# Patient Record
Sex: Female | Born: 1960
Health system: Southern US, Community
[De-identification: ages and names within clinical notes are randomized; demographics above are authoritative.]

## PROBLEM LIST (undated history)

## (undated) DIAGNOSIS — R51 Headache: Secondary | ICD-10-CM

## (undated) HISTORY — PX: TONSILLECTOMY: SUR1361

---

## 2013-01-26 ENCOUNTER — Encounter (HOSPITAL_COMMUNITY): Payer: Self-pay | Admitting: Certified Registered Nurse Anesthetist

## 2013-01-26 ENCOUNTER — Inpatient Hospital Stay (HOSPITAL_COMMUNITY)
Admission: AD | Admit: 2013-01-26 | Discharge: 2013-01-29 | DRG: 581 | Disposition: A | Discharge status: Home Health | Payer: No Typology Code available for payment source | Source: Other Acute Inpatient Hospital | Attending: Internal Medicine | Admitting: Internal Medicine

## 2013-01-26 ENCOUNTER — Encounter (HOSPITAL_COMMUNITY): Payer: Self-pay | Admitting: Internal Medicine

## 2013-01-26 ENCOUNTER — Encounter (HOSPITAL_COMMUNITY): Payer: Self-pay

## 2013-01-26 ENCOUNTER — Encounter (HOSPITAL_COMMUNITY)
Admission: AD | Disposition: A | Discharge status: Home Health | Payer: Self-pay | Source: Other Acute Inpatient Hospital | Attending: Internal Medicine

## 2013-01-26 ENCOUNTER — Inpatient Hospital Stay (HOSPITAL_COMMUNITY): Payer: No Typology Code available for payment source | Admitting: Certified Registered Nurse Anesthetist

## 2013-01-26 DIAGNOSIS — Z23 Encounter for immunization: Secondary | ICD-10-CM

## 2013-01-26 DIAGNOSIS — M65849 Other synovitis and tenosynovitis, unspecified hand: Secondary | ICD-10-CM | POA: Diagnosis present

## 2013-01-26 DIAGNOSIS — R51 Headache: Secondary | ICD-10-CM

## 2013-01-26 DIAGNOSIS — L089 Local infection of the skin and subcutaneous tissue, unspecified: Secondary | ICD-10-CM | POA: Diagnosis present

## 2013-01-26 DIAGNOSIS — D649 Anemia, unspecified: Secondary | ICD-10-CM

## 2013-01-26 DIAGNOSIS — W278XXA Contact with other nonpowered hand tool, initial encounter: Secondary | ICD-10-CM | POA: Diagnosis present

## 2013-01-26 DIAGNOSIS — M65839 Other synovitis and tenosynovitis, unspecified forearm: Secondary | ICD-10-CM | POA: Diagnosis present

## 2013-01-26 DIAGNOSIS — S61209A Unspecified open wound of unspecified finger without damage to nail, initial encounter: Secondary | ICD-10-CM | POA: Diagnosis present

## 2013-01-26 DIAGNOSIS — Z881 Allergy status to other antibiotic agents status: Secondary | ICD-10-CM

## 2013-01-26 DIAGNOSIS — G8929 Other chronic pain: Secondary | ICD-10-CM | POA: Diagnosis present

## 2013-01-26 DIAGNOSIS — G5600 Carpal tunnel syndrome, unspecified upper limb: Secondary | ICD-10-CM | POA: Diagnosis present

## 2013-01-26 DIAGNOSIS — Y92009 Unspecified place in unspecified non-institutional (private) residence as the place of occurrence of the external cause: Secondary | ICD-10-CM

## 2013-01-26 DIAGNOSIS — L02519 Cutaneous abscess of unspecified hand: Principal | ICD-10-CM | POA: Diagnosis present

## 2013-01-26 DIAGNOSIS — L03019 Cellulitis of unspecified finger: Principal | ICD-10-CM | POA: Diagnosis present

## 2013-01-26 DIAGNOSIS — I891 Lymphangitis: Secondary | ICD-10-CM | POA: Diagnosis present

## 2013-01-26 DIAGNOSIS — B950 Streptococcus, group A, as the cause of diseases classified elsewhere: Secondary | ICD-10-CM | POA: Diagnosis present

## 2013-01-26 DIAGNOSIS — L0291 Cutaneous abscess, unspecified: Secondary | ICD-10-CM

## 2013-01-26 DIAGNOSIS — Z9089 Acquired absence of other organs: Secondary | ICD-10-CM

## 2013-01-26 DIAGNOSIS — R519 Headache, unspecified: Secondary | ICD-10-CM | POA: Diagnosis present

## 2013-01-26 DIAGNOSIS — L0390 Cellulitis, unspecified: Secondary | ICD-10-CM

## 2013-01-26 HISTORY — DX: Headache: R51

## 2013-01-26 HISTORY — PX: I & D EXTREMITY: SHX5045

## 2013-01-26 SURGERY — IRRIGATION AND DEBRIDEMENT EXTREMITY
Anesthesia: General | Site: Hand | Laterality: Right | Wound class: Dirty or Infected

## 2013-01-26 MED ORDER — SODIUM CHLORIDE 0.9 % IJ SOLN
3.0000 mL | Freq: Two times a day (BID) | INTRAMUSCULAR | Status: DC
  Administered 2013-01-27 – 2013-01-29 (×3): 3 mL via INTRAVENOUS

## 2013-01-26 MED ORDER — VANCOMYCIN HCL 1000 MG IV SOLR
1000.0000 mg | INTRAVENOUS | Status: DC | PRN
  Administered 2013-01-26: 1000 mg via INTRAVENOUS

## 2013-01-26 MED ORDER — LACTATED RINGERS IV SOLN
INTRAVENOUS | Status: DC | PRN
  Administered 2013-01-26: 23:00:00 via INTRAVENOUS

## 2013-01-26 MED ORDER — BACITRACIN 500 UNIT/GM EX OINT
1.0000 "application " | TOPICAL_OINTMENT | Freq: Once | CUTANEOUS | Status: DC
  Filled 2013-01-26 (×2): qty 0.9

## 2013-01-26 MED ORDER — FENTANYL CITRATE 0.05 MG/ML IJ SOLN
INTRAMUSCULAR | Status: DC | PRN
  Administered 2013-01-26: 50 ug via INTRAVENOUS
  Administered 2013-01-26 (×2): 25 ug via INTRAVENOUS

## 2013-01-26 MED ORDER — ONDANSETRON HCL 4 MG/2ML IJ SOLN
INTRAMUSCULAR | Status: DC | PRN
  Administered 2013-01-26: 4 mg via INTRAVENOUS

## 2013-01-26 MED ORDER — OXYCODONE HCL 5 MG PO TABS: 5.0000 mg | ORAL_TABLET | Freq: Once | ORAL | Status: AC | PRN

## 2013-01-26 MED ORDER — CLINDAMYCIN PHOSPHATE 600 MG/50ML IV SOLN
600.0000 mg | Freq: Three times a day (TID) | INTRAVENOUS | Status: DC
  Administered 2013-01-27 – 2013-01-28 (×5): 600 mg via INTRAVENOUS
  Filled 2013-01-26 (×8): qty 50

## 2013-01-26 MED ORDER — ACETAMINOPHEN 325 MG PO TABS
650.0000 mg | ORAL_TABLET | Freq: Four times a day (QID) | ORAL | Status: DC | PRN
  Administered 2013-01-27 – 2013-01-29 (×5): 650 mg via ORAL
  Filled 2013-01-26 (×5): qty 2

## 2013-01-26 MED ORDER — SODIUM CHLORIDE 0.9 % IR SOLN
Status: DC | PRN
  Administered 2013-01-26: 1

## 2013-01-26 MED ORDER — MIDAZOLAM HCL 5 MG/5ML IJ SOLN
INTRAMUSCULAR | Status: DC | PRN
  Administered 2013-01-26: 2 mg via INTRAVENOUS

## 2013-01-26 MED ORDER — LIDOCAINE HCL (CARDIAC) 20 MG/ML IV SOLN
INTRAVENOUS | Status: DC | PRN
  Administered 2013-01-26: 80 mg via INTRAVENOUS

## 2013-01-26 MED ORDER — HYDROMORPHONE HCL PF 1 MG/ML IJ SOLN
0.2500 mg | INTRAMUSCULAR | Status: DC | PRN
  Administered 2013-01-27: 0.5 mg via INTRAVENOUS
  Administered 2013-01-27: 0.25 mg via INTRAVENOUS
  Administered 2013-01-27: 0.5 mg via INTRAVENOUS
  Filled 2013-01-26 (×2): qty 1

## 2013-01-26 MED ORDER — ONDANSETRON HCL 4 MG PO TABS: 4.0000 mg | ORAL_TABLET | Freq: Four times a day (QID) | ORAL | Status: DC | PRN

## 2013-01-26 MED ORDER — SODIUM CHLORIDE 0.9 % IR SOLN
Status: DC | PRN
  Administered 2013-01-26

## 2013-01-26 MED ORDER — ONDANSETRON HCL 4 MG/2ML IJ SOLN: 4.0000 mg | Freq: Four times a day (QID) | INTRAMUSCULAR | Status: DC | PRN

## 2013-01-26 MED ORDER — ACETAMINOPHEN 650 MG RE SUPP: 650.0000 mg | Freq: Four times a day (QID) | RECTAL | Status: DC | PRN

## 2013-01-26 MED ORDER — PROPOFOL 10 MG/ML IV BOLUS
INTRAVENOUS | Status: DC | PRN
  Administered 2013-01-26: 170 mg via INTRAVENOUS

## 2013-01-26 MED ORDER — OXYCODONE HCL 5 MG/5ML PO SOLN: 5.0000 mg | Freq: Once | ORAL | Status: AC | PRN

## 2013-01-26 MED ORDER — VANCOMYCIN HCL IN DEXTROSE 1-5 GM/200ML-% IV SOLN
INTRAVENOUS | Status: AC
  Filled 2013-01-26: qty 200

## 2013-01-26 MED ORDER — SODIUM CHLORIDE 0.9 % IV SOLN: INTRAVENOUS | Status: DC

## 2013-01-26 SURGICAL SUPPLY — 54 items
BAG DECANTER FOR FLEXI CONT (MISCELLANEOUS) ×2 IMPLANT
BANDAGE ELASTIC 4 VELCRO ST LF (GAUZE/BANDAGES/DRESSINGS) IMPLANT
BANDAGE ELASTIC 6 VELCRO ST LF (GAUZE/BANDAGES/DRESSINGS) IMPLANT
BANDAGE GAUZE ELAST BULKY 4 IN (GAUZE/BANDAGES/DRESSINGS) IMPLANT
BNDG COHESIVE 4X5 TAN STRL (GAUZE/BANDAGES/DRESSINGS) ×2 IMPLANT
CLOTH BEACON ORANGE TIMEOUT ST (SAFETY) ×2 IMPLANT
CORDS BIPOLAR (ELECTRODE) ×2 IMPLANT
COVER SURGICAL LIGHT HANDLE (MISCELLANEOUS) ×2 IMPLANT
CUFF TOURNIQUET SINGLE 18IN (TOURNIQUET CUFF) ×2 IMPLANT
CUFF TOURNIQUET SINGLE 24IN (TOURNIQUET CUFF) IMPLANT
CUFF TOURNIQUET SINGLE 44IN (TOURNIQUET CUFF) IMPLANT
DRAIN PENROSE 1/4X12 LTX STRL (WOUND CARE) ×2 IMPLANT
DRSG EMULSION OIL 3X3 NADH (GAUZE/BANDAGES/DRESSINGS) ×2 IMPLANT
DURAPREP 26ML APPLICATOR (WOUND CARE) ×2 IMPLANT
ELECT REM PT RETURN 9FT ADLT (ELECTROSURGICAL) ×2
ELECTRODE REM PT RTRN 9FT ADLT (ELECTROSURGICAL) ×1 IMPLANT
EVACUATOR 1/8 PVC DRAIN (DRAIN) IMPLANT
GAUZE XEROFORM 1X8 LF (GAUZE/BANDAGES/DRESSINGS) ×2 IMPLANT
GLOVE BIO SURGEON STRL SZ 6.5 (GLOVE) ×2 IMPLANT
GLOVE BIO SURGEON STRL SZ7.5 (GLOVE) ×2 IMPLANT
GLOVE BIOGEL PI IND STRL 6.5 (GLOVE) ×1 IMPLANT
GLOVE BIOGEL PI IND STRL 8 (GLOVE) ×1 IMPLANT
GLOVE BIOGEL PI INDICATOR 6.5 (GLOVE) ×1
GLOVE BIOGEL PI INDICATOR 8 (GLOVE) ×1
GOWN PREVENTION PLUS XXLARGE (GOWN DISPOSABLE) ×2 IMPLANT
GOWN STRL NON-REIN LRG LVL3 (GOWN DISPOSABLE) ×2 IMPLANT
HANDPIECE INTERPULSE COAX TIP (DISPOSABLE)
KIT BASIN OR (CUSTOM PROCEDURE TRAY) ×2 IMPLANT
KIT ROOM TURNOVER OR (KITS) ×2 IMPLANT
MANIFOLD NEPTUNE II (INSTRUMENTS) IMPLANT
NS IRRIG 1000ML POUR BTL (IV SOLUTION) ×2 IMPLANT
PACK ORTHO EXTREMITY (CUSTOM PROCEDURE TRAY) ×2 IMPLANT
PAD ARMBOARD 7.5X6 YLW CONV (MISCELLANEOUS) ×4 IMPLANT
PAD CAST 4YDX4 CTTN HI CHSV (CAST SUPPLIES) ×1 IMPLANT
PADDING CAST COTTON 4X4 STRL (CAST SUPPLIES) ×1
PENCIL BUTTON HOLSTER BLD 10FT (ELECTRODE) IMPLANT
SET HNDPC FAN SPRY TIP SCT (DISPOSABLE) IMPLANT
SPONGE GAUZE 4X4 12PLY (GAUZE/BANDAGES/DRESSINGS) IMPLANT
SPONGE LAP 18X18 X RAY DECT (DISPOSABLE) IMPLANT
STOCKINETTE IMPERVIOUS 9X36 MD (GAUZE/BANDAGES/DRESSINGS) IMPLANT
SUCTION FRAZIER TIP 8 FR DISP (SUCTIONS) ×1
SUCTION TUBE FRAZIER 8FR DISP (SUCTIONS) ×1 IMPLANT
SUT ETHILON 3 0 PS 1 (SUTURE) IMPLANT
SUT ETHILON 4 0 CL P 3 (SUTURE) ×2 IMPLANT
SUT ETHILON 4 0 PS 2 18 (SUTURE) ×2 IMPLANT
SYR 20CC LL (SYRINGE) ×2 IMPLANT
TOWEL OR 17X24 6PK STRL BLUE (TOWEL DISPOSABLE) ×2 IMPLANT
TOWEL OR 17X26 10 PK STRL BLUE (TOWEL DISPOSABLE) ×2 IMPLANT
TUBE ANAEROBIC SPECIMEN COL (MISCELLANEOUS) IMPLANT
TUBE CONNECTING 12X1/4 (SUCTIONS) ×2 IMPLANT
TUBE FEEDING 8FR 16IN STR KANG (MISCELLANEOUS) ×2 IMPLANT
UNDERPAD 30X30 INCONTINENT (UNDERPADS AND DIAPERS) ×2 IMPLANT
WATER STERILE IRR 1000ML POUR (IV SOLUTION) IMPLANT
YANKAUER SUCT BULB TIP NO VENT (SUCTIONS) IMPLANT

## 2013-01-26 NOTE — H&P (Addendum)
Michelle Allison is an 52 y.o. female. Patient was seen and examined on January 26, 2013. PCP - Dr. Garner Nash. Chief Complaint: Patient was transferred from [facility] for further management of the cellulitis of her right hand.   HPI: 52 year-old female with history of chronic headaches and anemia started noticing some swelling last Tuesday around 3 days ago in her index finger. She punctured it with a needle following which swelling worsen with erythema and pain fever and chills. It involved her whole and and forearm. She had gone to her PCP were prescribed doxycycline and Keflex despite which the swelling persisted and she was admitted to the hospital. IV vancomycin and ceftriaxone was given over there and since patient's symptoms did not improve patient was transferred for further management including possible surgery. Patient states that on Monday she had fed the horses. Blood cultures were obtained at [facility] and has to be followed.  Past Medical History  Diagnosis Date  . Headache     Past Surgical History  Procedure Date  . Tonsillectomy     Family History  Problem Relation Age of Onset  . Lymphoma Other    Social History:  reports that she has never smoked. She does not have any smokeless tobacco history on file. She reports that she does not drink alcohol or use illicit drugs.  Allergies:  Allergies  Allergen Reactions  . Augmentin (Amoxicillin-Pot Clavulanate) Rash    No prescriptions prior to admission    No results found for this or any previous visit (from the past 48 hour(s)). No results found.  Review of Systems  Constitutional: Negative.   HENT: Negative.   Eyes: Negative.   Respiratory: Negative.   Cardiovascular: Negative.   Gastrointestinal: Negative.   Genitourinary: Negative.   Musculoskeletal:       Right hand swelling and pain.  Skin: Negative.   Neurological: Negative.   Endo/Heme/Allergies: Negative.   Psychiatric/Behavioral:  Negative.     There were no vitals taken for this visit. Physical Exam  Constitutional: She is oriented to person, place, and time. She appears well-developed and well-nourished. No distress.  HENT:  Head: Normocephalic and atraumatic.  Right Ear: External ear normal.  Left Ear: External ear normal.  Nose: Nose normal.  Mouth/Throat: Oropharynx is clear and moist. No oropharyngeal exudate.  Eyes: Conjunctivae normal are normal. Pupils are equal, round, and reactive to light. Right eye exhibits no discharge. Left eye exhibits no discharge. No scleral icterus.  Neck: Normal range of motion. Neck supple.  Cardiovascular: Regular rhythm.        Mild tachycardia.  Respiratory: Effort normal and breath sounds normal. No respiratory distress. She has no wheezes. She has no rales.  GI: Soft. Bowel sounds are normal. She exhibits no distension. There is no tenderness. There is no rebound.  Musculoskeletal:       Swelling of the right hand with blisters in the right index fingers. Swelling extends to mid forearm with induration and erythema. Fingers are ina flexed position.  Neurological: She is alert and oriented to person, place, and time.       Moves all extremities.  Skin: She is not diaphoretic.  Psychiatric: Her behavior is normal.     Assessment/Plan #1. Cellulitis of the right hand - at this time I have discussed with surgeon Dr. Janee Morn who will be taking patient to the OR. Patient will be placed on vancomycin, clindamycin and Azactam as patient allergic to penicillin. Follow cultures obtained at [facility]  hospital. #2. Chronic anemia - follow labs ordered. As per patients PCP's notes patient has been uptodate on her vaccinations. #3. History of chronic headaches - presently denies any headache.  Patient's metabolic panel and CBC are pending.  CODE STATUS - full code.  KAKRAKANDY,ARSHAD N. 01/26/2013, 11:11 PM

## 2013-01-26 NOTE — Consult Note (Addendum)
ORTHOPAEDIC CONSULTATION  REQUESTING PHYSICIAN: Marinda Elk, MD  Chief Complaint: Right upper extremity infection  HPI: Michelle Allison is a 52 y.o. female who was transferred from [facility] and [facility] for concerns of a right hand infection. She reports that her right hand was less normal on Monday, and Tuesday morning she began to have pain and swelling and a lump forming in the pulp of the index finger. There was no specific prior trauma or known portal of entry. She tried to drain the tip of her finger herself with a needle dipped in alcohol. The finger became more painful, swollen, and she was admitted at 0100 today to the referring institution. I was contacted in the mid afternoon regarding possible transfer at the local general surgeon evaluated the patient and suggested she would benefit from hand surgical specialty consultation. She has been on vancomycin and has received a gram of Rocephin earlier in the day. Over the course of the day the infection has progressed and become much worse now affecting the radial half of the hand with redness streaking up the forearm both volarly and dorsally.  No past medical history on file. No past surgical history on file. History   Social History  . Marital Status: Married    Spouse Name: N/A    Number of Children: N/A  . Years of Education: N/A   Social History Main Topics  . Smoking status: Not on file  . Smokeless tobacco: Not on file  . Alcohol Use: Not on file  . Drug Use: Not on file  . Sexually Active: Not on file   Other Topics Concern  . Not on file   Social History Narrative  . No narrative on file   No family history on file. Allergies not on file Prior to Admission medications   Not on File   No results found.  Positive ROS: All other systems have been reviewed and were otherwise negative with the exception of those mentioned in the HPI and as above.  Physical Exam: Vitals: Refer to  EMR. Constitutional:  WD, WN, NAD HEENT:  NCAT, EOMI Neuro/Psych:  Alert & oriented to person, place, and time; appropriate mood & affect Lymphatic: No generalized UE edema or lymphadenopathy Extremities / MSK:  The extremities are normal with respect to appearance, ranges of motion, joint stability, muscle strength/tone, sensation, & perfusion except as otherwise noted:   The right hand is swollen, with the fingers held in a flexed posture. The long finger, ring finger, and small finger can be passively fully extended without significant pain, but the index finger resistance due to more pain. There is redness involving the index finger and radial half of the hand to include the thumb and thenar eminence. There are small bumps present on the skin of the dorsum of the hand. There is red streaking on the volar surface of the forearm to the level of the elbow, and some discontinuous streaks on the dorsum. There is minimal swelling of the forearm itself. The index finger is much more tightness will ring finger, or small finger. It is fairly caught. There are hemorrhagic bullae on the index finger volar and ulnar portions of the proximal phalanx. She can detect light touch on the surfaces of all the fingertips.  Assessment: Rapidly spreading infection of the right hand, likely with flexor tenosynovitis of the index finger, and ascending lymphangitis to the level of the elbow.  Plan: I discussed these findings with the patient and her husband.  I discussed how infections are primarily treated with antibiotics with adjunctive surgery when indicated.   From a surgical perspective, given her present clinical findings and rapidly evolving course, we will proceed to surgery tonight for exploration and incision and drainage/debridement of the right index finger and hand. I reviewed an operative plan of starting at the fingertip, exploring the bullae and debriding as necessary, incising the palm and continuing with  more proximal exploration until satisfied that the most proximal extent of the suppurative process has been contained. We discussed the possibility of tissue loss and the possible associated need for reconstructive procedures.  Questions were invited and answered. Consent was obtained.  From a medical management perspective, I will initially place the patient on Vancomycin and Zosyn, but defer definitive antibiotic selection & duration to the admitting hospitalist service, who may choose to manage this directly or obtain infectious disease consultation.

## 2013-01-26 NOTE — Preoperative (Signed)
Beta Blockers   Reason not to administer Beta Blockers:Not Applicable. No home beta blockers 

## 2013-01-26 NOTE — Anesthesia Procedure Notes (Signed)
Procedure Name: LMA Insertion Date/Time: 01/26/2013 11:18 PM Performed by: Garen Lah Pre-anesthesia Checklist: Patient identified, Timeout performed, Emergency Drugs available, Suction available and Patient being monitored Patient Re-evaluated:Patient Re-evaluated prior to inductionOxygen Delivery Method: Circle system utilized Preoxygenation: Pre-oxygenation with 100% oxygen Intubation Type: IV induction LMA: LMA inserted LMA Size: 4.0 Number of attempts: 1 Placement Confirmation: positive ETCO2 and breath sounds checked- equal and bilateral Tube secured with: Tape Dental Injury: Teeth and Oropharynx as per pre-operative assessment

## 2013-01-26 NOTE — Anesthesia Preprocedure Evaluation (Addendum)
Anesthesia Evaluation  Patient identified by MRN, date of birth, ID band Patient awake    Reviewed: Allergy & Precautions, H&P , NPO status , Patient's Chart, lab work & pertinent test results, reviewed documented beta blocker date and time   Airway Mallampati: II  Neck ROM: full    Dental  (+) Teeth Intact and Dental Advisory Given   Pulmonary          Cardiovascular     Neuro/Psych  Headaches,    GI/Hepatic   Endo/Other    Renal/GU      Musculoskeletal   Abdominal   Peds  Hematology   Anesthesia Other Findings   Reproductive/Obstetrics                          Anesthesia Physical Anesthesia Plan  ASA: II  Anesthesia Plan: General   Post-op Pain Management:    Induction: Intravenous  Airway Management Planned: LMA  Additional Equipment:   Intra-op Plan:   Post-operative Plan: Extubation in OR  Informed Consent: I have reviewed the patients History and Physical, chart, labs and discussed the procedure including the risks, benefits and alternatives for the proposed anesthesia with the patient or authorized representative who has indicated his/her understanding and acceptance.   Dental advisory given  Plan Discussed with: CRNA and Surgeon  Anesthesia Plan Comments:        Anesthesia Quick Evaluation

## 2013-01-27 LAB — BASIC METABOLIC PANEL
BUN: 9 mg/dL (ref 6–23)
Chloride: 102 mEq/L (ref 96–112)
GFR calc Af Amer: >90 mL/min (ref >90)
Potassium: 3.8 mEq/L (ref 3.5–5.1)
Sodium: 133 mEq/L — ABNORMAL LOW (ref 135–145)

## 2013-01-27 LAB — GRAM STAIN

## 2013-01-27 LAB — CBC
HCT: 31.2 % — ABNORMAL LOW (ref 36.0–46.0)
Hemoglobin: 10.6 g/dL — ABNORMAL LOW (ref 12.0–15.0)
MCHC: 34 g/dL (ref 30.0–36.0)
RBC: 3.56 MIL/uL — ABNORMAL LOW (ref 3.87–5.11)
WBC: 21.9 10*3/uL — ABNORMAL HIGH (ref 4.0–10.5)

## 2013-01-27 LAB — SEDIMENTATION RATE: Sed Rate: 42 mm/hr — ABNORMAL HIGH (ref 0–22)

## 2013-01-27 LAB — C-REACTIVE PROTEIN: CRP: 27.7 mg/dL — ABNORMAL HIGH (ref <0.60)

## 2013-01-27 MED ORDER — DEXTROSE 5 % IV SOLN
1.0000 g | Freq: Two times a day (BID) | INTRAVENOUS | Status: DC
  Administered 2013-01-27 – 2013-01-28 (×3): 1 g via INTRAVENOUS
  Filled 2013-01-27 (×4): qty 1

## 2013-01-27 MED ORDER — DEXTROSE 5 % IV SOLN
2.0000 g | Freq: Once | INTRAVENOUS | Status: AC
  Administered 2013-01-27: 2 g via INTRAVENOUS
  Filled 2013-01-27: qty 2

## 2013-01-27 MED ORDER — VANCOMYCIN HCL IN DEXTROSE 1-5 GM/200ML-% IV SOLN
1000.0000 mg | Freq: Two times a day (BID) | INTRAVENOUS | Status: DC
  Administered 2013-01-27 – 2013-01-28 (×3): 1000 mg via INTRAVENOUS
  Filled 2013-01-27 (×4): qty 200

## 2013-01-27 MED ORDER — BIOTENE DRY MOUTH MT LIQD: 15.0000 mL | Freq: Two times a day (BID) | OROMUCOSAL | Status: DC

## 2013-01-27 MED ORDER — DEXTROSE 5 % IV SOLN
1.0000 g | Freq: Three times a day (TID) | INTRAVENOUS | Status: DC
  Administered 2013-01-27: 1 g via INTRAVENOUS
  Filled 2013-01-27 (×3): qty 1

## 2013-01-27 MED ORDER — DEXTROSE 5 % IV SOLN
1.0000 g | INTRAVENOUS | Status: DC
  Filled 2013-01-27: qty 10

## 2013-01-27 MED ORDER — BIOTENE DRY MOUTH MT LIQD
15.0000 mL | Freq: Two times a day (BID) | OROMUCOSAL | Status: DC
  Administered 2013-01-28: 15 mL via OROMUCOSAL

## 2013-01-27 MED ORDER — INFLUENZA VIRUS VACC SPLIT PF IM SUSP
0.5000 mL | INTRAMUSCULAR | Status: DC
  Filled 2013-01-27: qty 0.5

## 2013-01-27 MED ORDER — PIPERACILLIN-TAZOBACTAM 3.375 G IVPB
3.3750 g | Freq: Three times a day (TID) | INTRAVENOUS | Status: DC
  Filled 2013-01-27 (×2): qty 50

## 2013-01-27 MED ORDER — HYDROMORPHONE HCL PF 1 MG/ML IJ SOLN
INTRAMUSCULAR | Status: AC
  Filled 2013-01-27: qty 1

## 2013-01-27 MED ORDER — LACTATED RINGERS IV SOLN
INTRAVENOUS | Status: DC
  Administered 2013-01-27: 03:00:00 via INTRAVENOUS

## 2013-01-27 MED ORDER — OXYCODONE HCL 5 MG PO TABS
5.0000 mg | ORAL_TABLET | ORAL | Status: DC | PRN
  Administered 2013-01-27 – 2013-01-29 (×9): 5 mg via ORAL
  Filled 2013-01-27 (×8): qty 1

## 2013-01-27 MED ORDER — INDOMETHACIN ER 75 MG PO CPCR
75.0000 mg | ORAL_CAPSULE | Freq: Every day | ORAL | Status: DC
  Administered 2013-01-27 – 2013-01-29 (×3): 75 mg via ORAL
  Filled 2013-01-27 (×4): qty 1

## 2013-01-27 MED ORDER — VANCOMYCIN HCL 10 G IV SOLR
1500.0000 mg | Freq: Once | INTRAVENOUS | Status: AC
  Administered 2013-01-27: 1500 mg via INTRAVENOUS
  Filled 2013-01-27: qty 1500

## 2013-01-27 MED ORDER — VANCOMYCIN HCL 10 G IV SOLR
1500.0000 mg | Freq: Two times a day (BID) | INTRAVENOUS | Status: DC
  Filled 2013-01-27: qty 1500

## 2013-01-27 MED ORDER — OXYCODONE HCL 5 MG PO TABS
10.0000 mg | ORAL_TABLET | ORAL | Status: DC | PRN
  Administered 2013-01-27 – 2013-01-29 (×3): 10 mg via ORAL
  Filled 2013-01-27 (×4): qty 2

## 2013-01-27 NOTE — Progress Notes (Signed)
Subjective: Reportedly feels overall better--less constitutional sickness.  Tol PO, had some bloody drainage today. Pain in limb decreasing   Objective: Vital signs in last 24 hours: Temp:  [97.8 F (36.6 C)-98.8 F (37.1 C)] 98.5 F (36.9 C) (02/06 1327) Pulse Rate:  [105-111] 107  (02/06 1327) Resp:  [18-19] 18  (02/06 1327) BP: (108-115)/(68-71) 108/71 mmHg (02/06 1327) SpO2:  [96 %-97 %] 96 % (02/06 1327) FiO2 (%):  [28 %] 28 % (02/05 2306) Weight:  [78.926 kg (174 lb)] 78.926 kg (174 lb) (02/06 0411)  Intake/Output from previous day: 02/05 0701 - 02/06 0700 In: 750 [I.V.:700; IV Piggyback:50] Out: 0  Intake/Output this shift: Total I/O In: 1971.7 [P.O.:920; I.V.:801.7; IV Piggyback:250] Out: -    Basename 01/27/13 0540  HGB 10.6*    Basename 01/27/13 0540  WBC 21.9*  RBC 3.56*  HCT 31.2*  PLT 169    Basename 01/27/13 0540  NA 133*  K 3.8  CL 102  CO2 23  BUN 9  CREATININE 0.66  GLUCOSE 138*  CALCIUM 7.6*   No results found for this basename: LABPT:2,INR:2 in the last 72 hours  dressing split, wound inspected.  IF tip warm & pink, good turgor, decreased LT sens.   Less redness in streaks on volar / dorsal FA.  Digits still swollen, thenar eminence less tense.  Assessment/Plan: POD 0, process seems to be turning corner. No guiding cx results from here. Will check on blood cx from [facility].  Will manage wound and determinations about whether any more surgical tx indicated, as well as outpt f/u for functional restoration. Will defer antibiotic tx to medicine +/- ID for selection/duration/decision to stop/determination of infection resolution/etc.    Reeve Mallo A. 01/27/2013, 5:56 PM

## 2013-01-27 NOTE — Transfer of Care (Signed)
Immediate Anesthesia Transfer of Care Note  Patient: Michelle Allison  Procedure(s) Performed: Procedure(s) (LRB) with comments: IRRIGATION AND DEBRIDEMENT EXTREMITY (Right)  Patient Location: PACU  Anesthesia Type:General  Level of Consciousness: sedated  Airway & Oxygen Therapy: Patient Spontanous Breathing and Patient connected to nasal cannula oxygen  Post-op Assessment: Report given to PACU RN and Post -op Vital signs reviewed and stable  Post vital signs: Reviewed and stable  Complications: No apparent anesthesia complications

## 2013-01-27 NOTE — Progress Notes (Signed)
ANTIBIOTIC CONSULT NOTE - INITIAL  Pharmacy Consult for vancomycin and aztreonam Indication: cellulitis  Allergies  Allergen Reactions  . Augmentin (Amoxicillin-Pot Clavulanate) Rash    Patient Measurements: Body Weight: 84kg  Vital Signs: Temp: 97.8 F (36.6 C) (02/06 0100) BP: 111/68 mmHg (02/06 0045) Pulse Rate: 111  (02/06 0045)   Microbiology: Recent Results (from the past 720 hour(s))  GRAM STAIN     Status: Normal   Collection Time   01/27/13 12:00 AM      Component Value Range Status Comment   Specimen Description WOUND RIGHT HAND   Final    Special Requests NONE   Final    Gram Stain     Final    Value: RARE WBC PRESENT,BOTH PMN AND MONONUCLEAR     NO ORGANISMS SEEN     CALLED TO J.ZHU RN 0056 01/26/2013 E.GADDY   Report Status 01/27/2013 FINAL   Final     Medical History: Past Medical History  Diagnosis Date  . Headache     Medications:  No prescriptions prior to admission   Scheduled:    . antiseptic oral rinse  15 mL Mouth Rinse BID  . aztreonam  2 g Intravenous Once  . clindamycin (CLEOCIN) IV  600 mg Intravenous Q8H  . HYDROmorphone      . indomethacin  75 mg Oral Q breakfast  . sodium chloride  3 mL Intravenous Q12H  . [DISCONTINUED] antiseptic oral rinse  15 mL Mouth Rinse BID  . [DISCONTINUED] bacitracin  1 application Topical Once    Assessment: 52yo female transferred from [facility] for surgical intervention of cellulitis of finger/hand, now s/p I&D, to continue ABX; had been receiving vanc and Rocephin at [facility] (SCr at [facility] was 0.75).  Goal of Therapy:  Vancomycin trough level 10-15 mcg/ml  Plan:  Dose of vanc at [facility] was 1g Q12H with last dose given 2/5 @1530 ; will continue with vancomycin 1500mg  IV Q12H as well as aztreonam 2g x1 (ordered by MD) followed by 1g IV Q8H and monitor CBC, Cx, levels prn.  Colleen Can PharmD BCPS 01/27/2013,1:39 AM

## 2013-01-27 NOTE — OR Nursing (Signed)
Dr Vivia Ewing in to assess r index finger color, etc, ok with ice to back of hand

## 2013-01-27 NOTE — Care Management Note (Unsigned)
    Page 1 of 1   01/27/2013     4:50:18 PM   CARE MANAGEMENT NOTE 01/27/2013  Patient:  Michelle Allison, Michelle Allison   Account Number:  [id number]  Date Initiated:  01/27/2013  Documentation initiated by:  Letha Cape  Subjective/Objective Assessment:   dx cellulitis  admit- lives with spouse. pta independent.     Action/Plan:   Anticipated DC Date:  01/30/2013   Anticipated DC Plan:  HOME/SELF CARE      DC Planning Services  CM consult      Choice offered to / List presented to:             Status of service:  In process, will continue to follow Medicare Important Message given?   (If response is "NO", the following Medicare IM given date fields will be blank) Date Medicare IM given:   Date Additional Medicare IM given:    Discharge Disposition:    Per UR Regulation:  Reviewed for med. necessity/level of care/duration of stay  If discussed at Long Length of Stay Meetings, dates discussed:    Comments:  01/27/13 16:49 Letha Cape RN, BSN [phone] patient lives with spouse, pta indep.  Patient has medication coverage and transportation at dc.  NCM will cont to follow for dc needs.

## 2013-01-27 NOTE — Progress Notes (Signed)
[facility] PROGRESS NOTE  Assessment/Plan: Cellulitis & abscess: - vanc/zosyn and clindamycin 2.6.2014. She was on rocephin at [facility]  - I&D 2.6.2014. Will consult ID to follow up as an outpatient. - will follow up on cultures at [facility]. - consult ID to follow up on antibiotics. - ESR high repeat in am follow PLT's.  Anemia (01/26/2013) - fertile female. - follow up as an outpatient. - ferritin will be high if check 2/2 to infectious etiology    Code Status: full Family Communication: none  Disposition Plan: home 1-2 days   Consultants:  Orthopedic hand surgery  Procedures:  I&D 2.5.2014  Antibiotics: Vanc/ zosyn and clindamycin 2.6.2014  HPI/Subjective: Continues to have pain  Objective: Filed Vitals:   01/27/13 0030 01/27/13 0045 01/27/13 0100 01/27/13 0411  BP: 115/70 111/68  110/68  Pulse: 109 111  105  Temp: 97.9 F (36.6 C)  97.8 F (36.6 C) 98.8 F (37.1 C)  TempSrc:    Oral  Resp: 18 19  18   Height:    5\' 5"  (1.651 m)  Weight:    78.926 kg (174 lb)  SpO2: 97% 97%      Intake/Output Summary (Last 24 hours) at 01/27/13 1032 Last data filed at 01/27/13 0900  Gross per 24 hour  Intake   1176 ml  Output      0 ml  Net   1176 ml   Filed Weights   01/27/13 0411  Weight: 78.926 kg (174 lb)    Exam:  General: Alert, awake, oriented x3, in no acute distress.  HEENT: No bruits, no goiter.  Heart: Regular rate and rhythm, without murmurs, rubs, gallops.  Lungs: Good air movement, clear to auscultation. Abdomen: Soft, nontender, nondistended, positive bowel sounds.  Neuro: Grossly intact, nonfocal.   Data Reviewed: Basic Metabolic Panel:  Lab 01/27/13 1610  NA 133*  K 3.8  CL 102  CO2 23  GLUCOSE 138*  BUN 9  CREATININE 0.66  CALCIUM 7.6*  MG --  PHOS --   Liver Function Tests: No results found for this basename: AST:5,ALT:5,ALKPHOS:5,BILITOT:5,PROT:5,ALBUMIN:5 in the last 168 hours No results found for this  basename: LIPASE:5,AMYLASE:5 in the last 168 hours No results found for this basename: AMMONIA:5 in the last 168 hours CBC:  Lab 01/27/13 0540  WBC 21.9*  NEUTROABS --  HGB 10.6*  HCT 31.2*  MCV 87.6  PLT 169   Cardiac Enzymes: No results found for this basename: CKTOTAL:5,CKMB:5,CKMBINDEX:5,TROPONINI:5 in the last 168 hours BNP (last 3 results) No results found for this basename: PROBNP:3 in the last 8760 hours CBG: No results found for this basename: GLUCAP:5 in the last 168 hours  Recent Results (from the past 240 hour(s))  WOUND CULTURE     Status: Normal (Preliminary result)   Collection Time   01/27/13 12:00 AM      Component Value Range Status Comment   Specimen Description WOUND RIGHT HAND   Final    Special Requests NONE   Final    Gram Stain     Final    Value: RARE WBC PRESENT, PREDOMINANTLY PMN     NO SQUAMOUS EPITHELIAL CELLS SEEN     NO ORGANISMS SEEN   Culture NO GROWTH   Final    Report Status PENDING   Incomplete   ANAEROBIC CULTURE     Status: Normal (Preliminary result)   Collection Time   01/27/13 12:00 AM      Component Value Range Status Comment   Specimen Description  WOUND RIGHT HAND   Final    Special Requests NONE   Final    Gram Stain     Final    Value: RARE WBC PRESENT, PREDOMINANTLY PMN     NO SQUAMOUS EPITHELIAL CELLS SEEN     NO ORGANISMS SEEN   Culture     Final    Value: NO ANAEROBES ISOLATED; CULTURE IN PROGRESS FOR 5 DAYS   Report Status PENDING   Incomplete   GRAM STAIN     Status: Normal   Collection Time   01/27/13 12:00 AM      Component Value Range Status Comment   Specimen Description WOUND RIGHT HAND   Final    Special Requests NONE   Final    Gram Stain     Final    Value: RARE WBC PRESENT,BOTH PMN AND MONONUCLEAR     NO ORGANISMS SEEN     CALLED TO J.ZHU RN 0056 01/26/2013 E.GADDY   Report Status 01/27/2013 FINAL   Final      Studies: No results found.  Scheduled Meds:   . antiseptic oral rinse  15 mL Mouth Rinse BID   . cefTRIAXone (ROCEPHIN)  IV  1 g Intravenous Q24H  . clindamycin (CLEOCIN) IV  600 mg Intravenous Q8H  . HYDROmorphone      . indomethacin  75 mg Oral Q breakfast  . influenza  inactive virus vaccine  0.5 mL Intramuscular Tomorrow-1000  . sodium chloride  3 mL Intravenous Q12H  . vancomycin  1,000 mg Intravenous Q12H   Continuous Infusions:   . lactated ringers 100 mL/hr at 01/27/13 0324     Marinda Elk  [facility] Pager [phone]. If 8PM-8AM, please contact night-coverage at [email or web address], password [facility] 01/27/2013, 10:32 AM  LOS: 1 day

## 2013-01-27 NOTE — Progress Notes (Addendum)
ANTIBIOTIC CONSULT NOTE - INITIAL  Pharmacy Consult for Zosyn & Vancomycin Indication: Infection of finger/hand  Allergies  Allergen Reactions  . Augmentin (Amoxicillin-Pot Clavulanate) Rash   Patient Measurements: Height: 5\' 5"  (165.1 cm) Weight: 174 lb (78.926 kg) IBW/kg (Calculated) : 57   Vital Signs: Temp: 98.8 F (37.1 C) (02/06 0411) Temp src: Oral (02/06 0411) BP: 110/68 mmHg (02/06 0411) Pulse Rate: 105  (02/06 0411)  Labs:  Basename 01/27/13 0540  WBC 21.9*  HGB 10.6*  PLT 169  LABCREA --  CREATININE 0.66   Estimated Creatinine Clearance: 88.4 ml/min (by C-G formula based on Cr of 0.66).   Microbiology: Recent Results (from the past 720 hour(s))  WOUND CULTURE     Status: Normal (Preliminary result)   Collection Time   01/27/13 12:00 AM      Component Value Range Status Comment   Specimen Description WOUND RIGHT HAND   Final    Special Requests NONE   Final    Gram Stain     Final    Value: RARE WBC PRESENT, PREDOMINANTLY PMN     NO SQUAMOUS EPITHELIAL CELLS SEEN     NO ORGANISMS SEEN   Culture NO GROWTH   Final    Report Status PENDING   Incomplete   ANAEROBIC CULTURE     Status: Normal (Preliminary result)   Collection Time   01/27/13 12:00 AM      Component Value Range Status Comment   Specimen Description WOUND RIGHT HAND   Final    Special Requests NONE   Final    Gram Stain     Final    Value: RARE WBC PRESENT, PREDOMINANTLY PMN     NO SQUAMOUS EPITHELIAL CELLS SEEN     NO ORGANISMS SEEN   Culture PENDING   Incomplete    Report Status PENDING   Incomplete   GRAM STAIN     Status: Normal   Collection Time   01/27/13 12:00 AM      Component Value Range Status Comment   Specimen Description WOUND RIGHT HAND   Final    Special Requests NONE   Final    Gram Stain     Final    Value: RARE WBC PRESENT,BOTH PMN AND MONONUCLEAR     NO ORGANISMS SEEN     CALLED TO J.ZHU RN 0056 01/26/2013 E.GADDY   Report Status 01/27/2013 FINAL   Final      Assessment: 52 yo female continuing on IV antibiotics for cellulitis of finger/hand, now s/p I&D. Received vancomycin and rocephin at [facility] prior to transfer to [facility]. Patient is afebrile, WBC 21.9, CrCl ~20ml/min, wound cultures pending. MD aware of rash with Augmentin (per patient record has tolerated other beta-lactams).   Vancomycin 2/6 >>  Clindamycin 2/6>> Zosyn 2/6 >> Aztreonam 2/6 - 2/6 (recieved 2 doses)  Goal of Therapy:  Vancomycin trough level 10-15 mcg/ml Eradication of infection  Plan:  1) Change Vancomycin to 1000mg  IV  Q12 hours 2) Start Zosyn 3.375g Q8 hours (4-hr infusion) 3) Benjaman Pott, PharmD 01/27/2013   9:54 AM  -----  Addendum: Discussed with Dr. David Stall. Patient has history of rash with Augmentin but has tolerated cephalosporins. MD changed Zosyn to cefepime.  Plan:  1) DC Zosyn (patient did not get dose) 2) Start cefepime 1g IV Q12 hours 3) F/u culture data, renal function, LOT, clinical status, f/u ID recommendations  Benjaman Pott, PharmD, BCPS 01/27/2013   12:15 PM

## 2013-01-27 NOTE — OR Nursing (Signed)
Has mild pink/bluish discoloration r index finger/ able to minimally wiggle exposed fingers and thumb/ diminshed sensation

## 2013-01-27 NOTE — Anesthesia Postprocedure Evaluation (Signed)
Anesthesia Post Note  Patient: Michelle Allison  Procedure(s) Performed: Procedure(s) (LRB): IRRIGATION AND DEBRIDEMENT EXTREMITY (Right)  Anesthesia type: General  Patient location: PACU  Post pain: Pain level controlled and Adequate analgesia  Post assessment: Post-op Vital signs reviewed, Patient's Cardiovascular Status Stable, Respiratory Function Stable, Patent Airway and Pain level controlled  Last Vitals:  Filed Vitals:   01/27/13 0100  BP:   Pulse:   Temp: 36.6 C  Resp:     Post vital signs: Reviewed and stable  Level of consciousness: awake, alert  and oriented  Complications: No apparent anesthesia complications

## 2013-01-27 NOTE — Op Note (Signed)
01/26/2013 - 01/27/2013  12:42 AM  PATIENT:  Michelle Allison  52 y.o. female  PRE-OPERATIVE DIAGNOSIS:  Rapidly evolving infection of right hand  POST-OPERATIVE DIAGNOSIS:  Same  PROCEDURE:  Incision and drainage of right index finger and hand with tenosynovium debridement, debridement of hemorrhagic bullae, and carpal tunnel release  SURGEON: Cliffton Asters. Janee Morn, MD  PHYSICIAN ASSISTANT: None  ANESTHESIA:  general  SPECIMENS:  None  DRAINS:   None  PREOPERATIVE INDICATIONS:  [name] is a  52 y.o. female with a diagnosis of right hand infection who failed conservative measures and elected for surgical management.    The risks benefits and alternatives were discussed with the patient preoperatively including but not limited to the risks of infection, bleeding, nerve injury, cardiopulmonary complications, the need for revision surgery, among others, and the patient verbalized understanding and consented to proceed.  OPERATIVE IMPLANTS: None  OPERATIVE FINDINGS: Seropurulent fluid throughout the right index finger, particularly within the tendon sheath and volar subcutaneous space. The flexor tenosynovium of the index finger flexor tendons was of a slimy, snotty consistency. It was similar fluid in the mid palm, radial to the midline septum, extending into the distal aspect of the carpal tunnel but not beyond. No frank pus was encountered.  OPERATIVE PROCEDURE:  After receiving prophylactic antibiotics, the patient was escorted to the operative theatre and placed in a supine position.  A surgical "time-out" was performed during which the planned procedure, proposed operative site, and the correct patient identity were compared to the operative consent and agreement confirmed by the circulating nurse according to current facility policy.  General anesthesia was administered. Following application of a tourniquet to the operative extremity, the exposed skin was prepped with Chloraprep and  draped in the usual sterile fashion.  The limb was exsanguinated with elevation and the tourniquet inflated to approximately higher than systolic BP.  The  epidermis over the hemorrhagic bullae was debrided back to stable skin. The dermis underneath the volar was mottled & injected. Initially an oblique incision was made over the pulp of the index finger. Fluid that was seropurulent was encountered and this was cultured. Cultures were sent for AFB, fungus, aerobic and anaerobic cultures.  A zigzag incision was made from the proximal palmar crease of the index finger eventually to the distal wrist crease. The A1 pulley was not split. Proximal to the A1 pulley fluid could be seen coming from the flexor tendon sheath and the tenosynovium was snotty. It was debrided. Using a pediatric feeding tube, the index finger volar subcutaneous space was irrigated from proximal to distal with good egress of fluid. The tendon sheath was also irrigated in this manner both from proximal to distal and distal to proximal. That seropurulent fluid was encountered all the way to the distal aspect of the transverse carpal ligament. The carpal tunnel was released and the distal forearm fascia was split for another couple inches proximal to the proximal end of the incision to ensure that there was no compression of the median nerve. The tissues in the carpal tunnel and proximal to it appeared normal. The thenar musculature was normal, pink and viable. The open wound was very copiously irrigated the tourniquet was released. The finger was pale for quite some time but was pink and the recovery room. The skin was closed with 4-0 nylon interrupted sutures with a small Penrose drain emanating from 2 portions of the wound. The distal wound on the fingertip was closed with one single  simple suture. A bulky hand dressing was applied and she was taken to recovery room stable condition breathing spontaneously  DISPOSITION: She'll be  transferred back to the floor for continued inpatient care. Antibiotic management is deferred to the hospitalists.  I will plan to take the dressing down and further evaluate the wound later today or Friday.

## 2013-01-28 ENCOUNTER — Encounter (HOSPITAL_COMMUNITY): Payer: Self-pay | Admitting: Orthopedic Surgery

## 2013-01-28 DIAGNOSIS — L02519 Cutaneous abscess of unspecified hand: Secondary | ICD-10-CM

## 2013-01-28 DIAGNOSIS — L03119 Cellulitis of unspecified part of limb: Secondary | ICD-10-CM

## 2013-01-28 DIAGNOSIS — B950 Streptococcus, group A, as the cause of diseases classified elsewhere: Secondary | ICD-10-CM

## 2013-01-28 DIAGNOSIS — L089 Local infection of the skin and subcutaneous tissue, unspecified: Secondary | ICD-10-CM | POA: Diagnosis present

## 2013-01-28 LAB — CBC
MCH: 29.5 pg (ref 26.0–34.0)
MCHC: 33.4 g/dL (ref 30.0–36.0)
Platelets: 160 10*3/uL (ref 150–400)

## 2013-01-28 LAB — SEDIMENTATION RATE: Sed Rate: 68 mm/hr — ABNORMAL HIGH (ref 0–22)

## 2013-01-28 MED ORDER — INDOMETHACIN ER 75 MG PO CPCR: 75.0000 mg | ORAL_CAPSULE | Freq: Every day | ORAL | Status: DC

## 2013-01-28 MED ORDER — DIPHENHYDRAMINE HCL 25 MG PO CAPS
25.0000 mg | ORAL_CAPSULE | Freq: Three times a day (TID) | ORAL | Status: DC | PRN
  Administered 2013-01-28: 25 mg via ORAL
  Filled 2013-01-28: qty 1

## 2013-01-28 MED ORDER — OXYCODONE HCL 5 MG PO TABS: 5.0000 mg | ORAL_TABLET | ORAL | Status: DC | PRN

## 2013-01-28 MED ORDER — DEXTROSE 5 % IV SOLN
1.0000 g | INTRAVENOUS | Status: DC
  Administered 2013-01-28 – 2013-01-29 (×2): 1 g via INTRAVENOUS
  Filled 2013-01-28 (×2): qty 10

## 2013-01-28 NOTE — Progress Notes (Signed)
Patient and her brother state that the patients blood pressure is 100/65. She states he normal blood pressure is always in the 110's when she goes to her doctor. It was explained to her and her brother that it may be a little low due her just waking up and that she has been taking pain medicines for the last day for her pain. Pt and brother were addiment that the provider on call was notified. MD Janee Morn was paged and made aware. No new orders were given. Will continue to monitor.   Michelle Bruins RN BSN

## 2013-01-28 NOTE — Consult Note (Signed)
[facility]    Date of Admission:  01/26/2013    Total days of antibiotics 4        Day 3 vancomycin        Day 2 cefepime        Day 2 clindamycin       Reason for Consult: Severe right hand infection    Referring Physician: Dr. Rosine Beat  Principal Problem:  *Group A streptococcal infection Active Problems:  Infection of hand  Headache  Anemia      . antiseptic oral rinse  15 mL Mouth Rinse BID  . ceFEPime (MAXIPIME) IV  1 g Intravenous Q12H  . clindamycin (CLEOCIN) IV  600 mg Intravenous Q8H  . indomethacin  75 mg Oral Q breakfast  . influenza  inactive virus vaccine  0.5 mL Intramuscular Tomorrow-1000  . sodium chloride  3 mL Intravenous Q12H  . vancomycin  1,000 mg Intravenous Q12H    Recommendations: 1. Consolidate antibiotic therapy to IV ceftriaxone 2. PICC placement 3. Arrange outpatient IV antibiotic therapy  4. Please call Dr. Enedina Finner [phone]) for any infectious disease questions this weekend  Assessment: Michelle Allison has severe, rapidly progressive group A streptococcal infection involving her right hand and wrist. Group A strep is a skin organism that was probably introduced by a splinter or the needle Michelle Allison used to try to remove the potential splinter. It is probably acting alone and I feel it is best that we narrow therapy to IV ceftriaxone. Given the severe, progressive nature of the infection involving her dominant hand I favor continuing treatment with IV antibiotics for at least 10-14 days. Final duration of therapy will depend on serial examination of her wounds and consultation with Dr. Janee Morn. Michelle Allison is in agreement with this plan. I will last have a PICC placed and arrange outpatient IV ceftriaxone. I will also arrange to have her followed up in our clinic next week.    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 Michelle Allison probed the area at the tip of the finger with a slowly needle but could not remove anything. The needle drew some blood. Her finger was all ready somewhat red and swollen at that time. Over the next 24 hour Michelle Allison had rapid progression of swelling and pain extending up her hand. Michelle Allison also noted subjective fevers, shaking chills and myalgias. Michelle Allison saw her primary care physician and was started on oral doxycycline and cephalexin but had rapid progression leading to admission at [facility]. Michelle Allison was treated with IV vancomycin and ceftriaxone and transferred here on February 5. Blood cultures [facility] are negative at 48 hours. Michelle Allison underwent right hand surgery 24 hours ago revealing pus along her index finger and upper palm to the level of the wrist. Operative cultures have grown group A strep. Michelle Allison is starting to feel much better.   Review of Systems: Pertinent items are noted in HPI.  Past Medical History  Diagnosis Date  . Headache     History  Substance Use Topics  . Smoking status: Never Smoker   . Smokeless tobacco: Not on file  . Alcohol Use: No    Family History  Problem Relation Age of Onset  . Lymphoma Other    Allergies  Allergen Reactions  . Augmentin (Amoxicillin-Pot Clavulanate) Rash    OBJECTIVE: Blood  pressure 100/52, pulse 95, temperature 98.2 F (36.8 C), temperature source Oral, resp. rate 18, height 5\' 5"  (1.651 m), weight 78.926 kg (174 lb), SpO2 98.00%. General: Michelle Allison is slightly groggy but in good spirits Skin: Her right hand is in a large bulky dressing. Michelle Allison has no lymphangitis above the dressing. Michelle Allison has no generalized rash, splinter or conjunctival hemorrhages. Lymph nodes: No palpable adenopathy Lungs: Clear Cor: Regular S1 and S2 with no murmurs Abdomen: Soft and nontender Joints and extremities: Normal other than right hand  Microbiology: Recent Results (from the past 240 hour(s))  WOUND CULTURE     Status: Normal (Preliminary  result)   Collection Time   01/27/13 12:00 AM      Component Value Range Status Comment   Specimen Description WOUND RIGHT HAND   Final    Special Requests NONE   Final    Gram Stain     Final    Value: RARE WBC PRESENT, PREDOMINANTLY PMN     NO SQUAMOUS EPITHELIAL CELLS SEEN     NO ORGANISMS SEEN   Culture FEW GROUP A STREP (S.PYOGENES) ISOLATED   Final    Report Status PENDING   Incomplete   ANAEROBIC CULTURE     Status: Normal (Preliminary result)   Collection Time   01/27/13 12:00 AM      Component Value Range Status Comment   Specimen Description WOUND RIGHT HAND   Final    Special Requests NONE   Final    Gram Stain     Final    Value: RARE WBC PRESENT, PREDOMINANTLY PMN     NO SQUAMOUS EPITHELIAL CELLS SEEN     NO ORGANISMS SEEN   Culture     Final    Value: NO ANAEROBES ISOLATED; CULTURE IN PROGRESS FOR 5 DAYS   Report Status PENDING   Incomplete   GRAM STAIN     Status: Normal   Collection Time   01/27/13 12:00 AM      Component Value Range Status Comment   Specimen Description WOUND RIGHT HAND   Final    Special Requests NONE   Final    Gram Stain     Final    Value: RARE WBC PRESENT,BOTH PMN AND MONONUCLEAR     NO ORGANISMS SEEN     CALLED TO J.ZHU RN 0056 01/26/2013 E.GADDY   Report Status 01/27/2013 FINAL   Final     Cliffton Asters, MD [facility] [phone] pager   [phone] cell 01/28/2013, 4:47 PM

## 2013-01-28 NOTE — Progress Notes (Signed)
[facility] PROGRESS NOTE  Assessment/Plan: Cellulitis & abscess: - vanc/zosyn and clindamycin 2.6.2014. She was on rocephin at [facility], get records form [facility]. - I&D 2.6.2014. Will consult ID to follow up as an outpatient. - ESR high still high leukocytosis improving.  Anemia (01/26/2013) - fertile female. - follow up as an outpatient. - ferritin will be high if check 2/2 to infectious etiology    Code Status: full Family Communication: none  Disposition Plan: home 1-2 days   Consultants:  Orthopedic hand surgery  Procedures:  I&D 2.5.2014  Antibiotics: Vanc/ zosyn and clindamycin 2.6.2014  HPI/Subjective: Pain control, numb index finger.  Objective: Filed Vitals:   01/27/13 0411 01/27/13 1327 01/27/13 2059 01/28/13 0546  BP: 110/68 108/71 100/66 100/65  Pulse: 105 107 90 102  Temp: 98.8 F (37.1 C) 98.5 F (36.9 C) 98.2 F (36.8 C) 98.4 F (36.9 C)  TempSrc: Oral Oral Oral Oral  Resp: 18 18 20 18   Height: 5\' 5"  (1.651 m)     Weight: 78.926 kg (174 lb)     SpO2:  96% 95% 94%    Intake/Output Summary (Last 24 hours) at 01/28/13 0959 Last data filed at 01/27/13 1550  Gross per 24 hour  Intake 1495.67 ml  Output      0 ml  Net 1495.67 ml   Filed Weights   01/27/13 0411  Weight: 78.926 kg (174 lb)    Exam:  General: Alert, awake, oriented x3, in no acute distress.  HEENT: No bruits, no goiter.  Heart: Regular rate and rhythm, without murmurs, rubs, gallops.  Lungs: Good air movement, clear to auscultation. Abdomen: Soft, nontender, nondistended, positive bowel sounds.  Neuro: Grossly intact, nonfocal.   Data Reviewed: Basic Metabolic Panel:  Lab 01/27/13 1610  NA 133*  K 3.8  CL 102  CO2 23  GLUCOSE 138*  BUN 9  CREATININE 0.66  CALCIUM 7.6*  MG --  PHOS --   Liver Function Tests: No results found for this basename: AST:5,ALT:5,ALKPHOS:5,BILITOT:5,PROT:5,ALBUMIN:5 in the last 168 hours No results found for this basename:  LIPASE:5,AMYLASE:5 in the last 168 hours No results found for this basename: AMMONIA:5 in the last 168 hours CBC:  Lab 01/28/13 0550 01/27/13 0540  WBC 13.4* 21.9*  NEUTROABS -- --  HGB 10.4* 10.6*  HCT 31.1* 31.2*  MCV 88.4 87.6  PLT 160 169   Cardiac Enzymes: No results found for this basename: CKTOTAL:5,CKMB:5,CKMBINDEX:5,TROPONINI:5 in the last 168 hours BNP (last 3 results) No results found for this basename: PROBNP:3 in the last 8760 hours CBG: No results found for this basename: GLUCAP:5 in the last 168 hours  Recent Results (from the past 240 hour(s))  WOUND CULTURE     Status: Normal (Preliminary result)   Collection Time   01/27/13 12:00 AM      Component Value Range Status Comment   Specimen Description WOUND RIGHT HAND   Final    Special Requests NONE   Final    Gram Stain     Final    Value: RARE WBC PRESENT, PREDOMINANTLY PMN     NO SQUAMOUS EPITHELIAL CELLS SEEN     NO ORGANISMS SEEN   Culture FEW GROUP A STREP (S.PYOGENES) ISOLATED   Final    Report Status PENDING   Incomplete   ANAEROBIC CULTURE     Status: Normal (Preliminary result)   Collection Time   01/27/13 12:00 AM      Component Value Range Status Comment   Specimen Description WOUND RIGHT HAND  Final    Special Requests NONE   Final    Gram Stain     Final    Value: RARE WBC PRESENT, PREDOMINANTLY PMN     NO SQUAMOUS EPITHELIAL CELLS SEEN     NO ORGANISMS SEEN   Culture     Final    Value: NO ANAEROBES ISOLATED; CULTURE IN PROGRESS FOR 5 DAYS   Report Status PENDING   Incomplete   GRAM STAIN     Status: Normal   Collection Time   01/27/13 12:00 AM      Component Value Range Status Comment   Specimen Description WOUND RIGHT HAND   Final    Special Requests NONE   Final    Gram Stain     Final    Value: RARE WBC PRESENT,BOTH PMN AND MONONUCLEAR     NO ORGANISMS SEEN     CALLED TO J.ZHU RN 0056 01/26/2013 E.GADDY   Report Status 01/27/2013 FINAL   Final      Studies: No results  found.  Scheduled Meds:    . antiseptic oral rinse  15 mL Mouth Rinse BID  . ceFEPime (MAXIPIME) IV  1 g Intravenous Q12H  . clindamycin (CLEOCIN) IV  600 mg Intravenous Q8H  . indomethacin  75 mg Oral Q breakfast  . influenza  inactive virus vaccine  0.5 mL Intramuscular Tomorrow-1000  . sodium chloride  3 mL Intravenous Q12H  . vancomycin  1,000 mg Intravenous Q12H   Continuous Infusions:    . lactated ringers 100 mL/hr at 01/27/13 0324     Marinda Elk  [facility] Pager [phone].  If 8PM-8AM, please contact night-coverage at [email or web address], password [facility] 01/28/2013, 9:59 AM  LOS: 2 days

## 2013-01-28 NOTE — Progress Notes (Addendum)
Subjective: POD 1+,  cx with Group A strep, appreciate ID taking lead on antibiotic treatment  Objective: Vital signs in last 24 hours: Temp:  [98.2 F (36.8 C)-98.4 F (36.9 C)] 98.2 F (36.8 C) (02/07 1431) Pulse Rate:  [90-102] 95  (02/07 1431) Resp:  [18-20] 18  (02/07 1431) BP: (100)/(52-66) 100/52 mmHg (02/07 1431) SpO2:  [94 %-98 %] 98 % (02/07 1431)  Intake/Output from previous day: 02/06 0701 - 02/07 0700 In: 1971.7 [P.O.:920; I.V.:801.7; IV Piggyback:250] Out: -  Intake/Output this shift:     Basename 01/28/13 0550 01/27/13 0540  HGB 10.4* 10.6*    Basename 01/28/13 0550 01/27/13 0540  WBC 13.4* 21.9*  RBC 3.52* 3.56*  HCT 31.1* 31.2*  PLT 160 169    Basename 01/27/13 0540  NA 133*  K 3.8  CL 102  CO2 23  BUN 9  CREATININE 0.66  GLUCOSE 138*  CALCIUM 7.6*   No results found for this basename: LABPT:2,INR:2 in the last 72 hours  Dressing removed, wounds re-dressed.  Redness receeding, drain pulled.  Digits still swollen, thenar eminence less tense, but still red.  Some clear thin fluid draining from drain holes.  Index finger skin adjacent to blisters is macerated.  Injected dermis intact where blisters debrided.  Assessment/Plan: POD 1+, process seems to be turning corner.  May D/C home with home IV antibiotic plan tomorrow if [facility] services in place. RTC to see me on Wednesday. Daily dressing changes.  Will begin formal hand therapy next week.  Dr. Yevette Edwards & PA Jason Coop covering over this weekend.  Will defer antibiotic tx to medicine +/- ID for selection/duration/decision to stop/determination of infection resolution/etc.    Michelle Allison A. 01/28/2013, 5:09 PM

## 2013-01-29 ENCOUNTER — Inpatient Hospital Stay (HOSPITAL_COMMUNITY): Payer: No Typology Code available for payment source

## 2013-01-29 DIAGNOSIS — B950 Streptococcus, group A, as the cause of diseases classified elsewhere: Secondary | ICD-10-CM

## 2013-01-29 DIAGNOSIS — L089 Local infection of the skin and subcutaneous tissue, unspecified: Secondary | ICD-10-CM

## 2013-01-29 LAB — WOUND CULTURE

## 2013-01-29 MED ORDER — DEXTROSE 5 % IV SOLN: 1.0000 g | INTRAVENOUS | Status: DC

## 2013-01-29 MED ORDER — POLYETHYLENE GLYCOL 3350 17 G PO PACK
17.0000 g | PACK | Freq: Every day | ORAL | Status: DC
  Filled 2013-01-29: qty 1

## 2013-01-29 MED ORDER — ACETAMINOPHEN 325 MG PO TABS: 650.0000 mg | ORAL_TABLET | Freq: Once | ORAL | Status: DC

## 2013-01-29 MED ORDER — PANTOPRAZOLE SODIUM 40 MG PO TBEC
40.0000 mg | DELAYED_RELEASE_TABLET | Freq: Two times a day (BID) | ORAL | Status: DC
  Administered 2013-01-29: 40 mg via ORAL
  Filled 2013-01-29: qty 1

## 2013-01-29 MED ORDER — PANTOPRAZOLE SODIUM 40 MG PO TBEC: 40.0000 mg | DELAYED_RELEASE_TABLET | Freq: Two times a day (BID) | ORAL | Status: DC

## 2013-01-29 MED ORDER — FERROUS SULFATE 325 (65 FE) MG PO TABS: 325.0000 mg | ORAL_TABLET | Freq: Three times a day (TID) | ORAL | Status: DC

## 2013-01-29 MED ORDER — HEPARIN SOD (PORK) LOCK FLUSH 100 UNIT/ML IV SOLN
250.0000 [IU] | INTRAVENOUS | Status: AC | PRN
  Administered 2013-01-29: 250 [IU]

## 2013-01-29 MED ORDER — INFLUENZA VIRUS VACC SPLIT PF IM SUSP
0.5000 mL | INTRAMUSCULAR | Status: DC
  Filled 2013-01-29: qty 0.5

## 2013-01-29 NOTE — Progress Notes (Signed)
CARE MANAGEMENT NOTE 01/29/2013  Patient:  Michelle Allison, Michelle Allison   Account Number:  [id number]  Date Initiated:  01/27/2013  Documentation initiated by:  Letha Cape  Subjective/Objective Assessment:   dx cellulitis  admit- lives with spouse. pta independent.     Action/Plan:   CM spoke with patient concerning home health needs at discharge. Choice offered. Patient will have PICC inserted for two weeks of IV Ceftrioxone. Faxed orders to [facility].   Anticipated DC Date:  01/29/2013   Anticipated DC Plan:  HOME W HOME HEALTH SERVICES      DC Planning Services  CM consult      [facility]  HOME HEALTH   Choice offered to / List presented to:  C-1 Patient        HH arranged  HH-1 RN  IV Antibiotics      HH agency  [facility]   Status of service:  Completed, signed off Medicare Important Message given?   (If response is "NO", the following Medicare IM given date fields will be blank) Date Medicare IM given:   Date Additional Medicare IM given:    Discharge Disposition:  HOME W HOME HEALTH SERVICES  Per UR Regulation:  Reviewed for med. necessity/level of care/duration of stay  If discussed at Long Length of Stay Meetings, dates discussed:    Comments:  01/27/13 16:49 Letha Cape RN, BSN [phone] patient lives with spouse, pta indep.  Patient has medication coverage and transportation at dc.  NCM will cont to follow for dc needs.

## 2013-01-29 NOTE — Progress Notes (Addendum)
Peripherally Inserted Central Catheter/Midline Placement  The IV Nurse has discussed with the patient and/or persons authorized to consent for the patient, the purpose of this procedure and the potential benefits and risks involved with this procedure.  The benefits include less needle sticks, lab draws from the catheter and patient may be discharged home with the catheter.  Risks include, but not limited to, infection, bleeding, blood clot (thrombus formation), and puncture of an artery; nerve damage and irregular heat beat.  Alternatives to this procedure were also discussed.  PICC/Midline Placement Documentation        Stacie Glaze Horton 01/29/2013, 1:07 PM  Attempted left upper arm PICC insertion times three, unable to access vein. RN aware. Will continue to monitor

## 2013-01-29 NOTE — Procedures (Signed)
Interventional Radiology Procedure Note  Procedure: Left basilic vein single lumen PowerPort with tip at superior CAJ and ready for use. Complications: None Recommendations: - Routine line care  Signed,  Sterling Big, MD Vascular & Interventional Radiologist [facility]

## 2013-01-29 NOTE — Progress Notes (Signed)
Note: received pt from IR s/p PICC @ 1630.  See previous note.  Dressing change done to right @ 1745.  Dry 4x4s applied x 2 then wrapped with kerlix.  Stitches intact full length of hand.  2x2 intact to index finger.  Minimum amt yellowish drainage on dressing removed.  Good CMS to right hand.  Can move hand and fingers.  1 + radial  Pulse to right wrist.

## 2013-01-29 NOTE — Progress Notes (Signed)
Michelle Allison discharged Home per MD order.  Discharge instructions reviewed and discussed with the patient and spouse, all questions and concerns answered. Copy of instructions and scripts given to patient.    Medication List    STOP taking these medications       cephALEXin 500 MG capsule  Commonly known as:  KEFLEX     doxycycline 100 MG tablet  Commonly known as:  VIBRA-TABS      TAKE these medications       acetaminophen 325 MG tablet  Commonly known as:  TYLENOL  Take 2 tablets (650 mg total) by mouth once.     dextrose 5 % SOLN 50 mL with cefTRIAXone 1 G SOLR 1 g  Inject 1 g into the vein daily.     ferrous sulfate 325 (65 FE) MG tablet  Commonly known as:  FERROUSUL  Take 1 tablet (325 mg total) by mouth 3 (three) times daily with meals.     indomethacin 75 MG CR capsule  Commonly known as:  INDOCIN SR  Take 1 capsule (75 mg total) by mouth daily with breakfast.     oxyCODONE 5 MG immediate release tablet  Commonly known as:  Oxy IR/ROXICODONE  Take 1-2 tablets (5-10 mg total) by mouth every 3 (three) hours as needed.     pantoprazole 40 MG tablet  Commonly known as:  PROTONIX  Take 1 tablet (40 mg total) by mouth 2 (two) times daily.        Patients skin is clean and dry with a CDDI to the rt hand. PICC line in place to LUE dressing CDI.  Patient escorted to car by NT in a wheelchair,  no distress noted upon discharge.  Julien Nordmann [facility] 01/29/2013 7:57 PM

## 2013-01-29 NOTE — Progress Notes (Signed)
Received from IR s/p PICC insertion to left upper arm.  Occlusive dressing intact and.  Patient for d/c after VSs completed (Q 30 min x 4).  IV paged to flush PICC for d/c.  VSS at 98.4, 126/76, 85, 16, 97% room air.

## 2013-01-29 NOTE — Progress Notes (Signed)
    Pt doing well, laying comfortably in hospital bed, has d/c'd pain meds due to fear of addiction, on tylenol only now, pain moderate in hand and index finger, eager to go home today accompanied by husband  BP 102/65  Pulse 94  Temp(Src) 99 F (37.2 C) (Oral)  Resp 20  Ht 5\' 5"  (1.651 m)  Wt 78.926 kg (174 lb)  BMI 28.96 kg/m2  SpO2 92% Laying comfortably in bed, ice packs and bandage around hand, elevated appropriately, upon removal of bandage swelling is moderate, incision is closing appropriately, some redness/streaking about the thumb but no longer any down the arm, demonstrates appropriate progress from previous exam.   POD 2+, process seems to be turning corner.  May D/C home with home IV antibiotic plan if [facility] services in place.  RTC to see Dr Janee Morn on Wednesday.  Change dressing and demonstrate daily dressing changes to pt. Begin formal hand therapy next week.  Pt may shower with hand covered today Will defer antibiotic tx to medicine +/- ID for selection/duration/decision to stop/determination of infection resolution/etc.

## 2013-01-29 NOTE — Discharge Summary (Addendum)
Physician Discharge Summary  Michelle Allison YNW:[id number] DOB: June 16, 1961 DOA: 01/26/2013  PCP: No primary provider on file.  Admit date: 01/26/2013 Discharge date: 01/29/2013  Time spent: 30 minutes  Recommendations for Outpatient Follow-up:  1. Follow up with Ortho.  Discharge Diagnoses:  Principal Problem:   Group A streptococcal infection Active Problems:   Headache   Anemia   Infection of hand   Discharge Condition: stabel  Diet recommendation: regular  Filed Weights   01/27/13 0411  Weight: 78.926 kg (174 lb)    History of present illness:  52 year-old female with history of chronic headaches and anemia started noticing some swelling last Tuesday around 3 days ago in her index finger. She punctured it with a needle following which swelling worsen with erythema and pain fever and chills. It involved her whole and and forearm. She had gone to her PCP were prescribed doxycycline and Keflex despite which the swelling persisted and she was admitted to the hospital. IV vancomycin and ceftriaxone was given over there and since patient's symptoms did not improve patient was transferred for further management including possible surgery. Patient states that on Monday she had fed the horses.  Blood cultures were obtained at [facility] and has to be followed.   Hospital Course:  Cellulitis & abscess: - vanc/zosyn and clindamycin 2.6.2014. - I&D 2.6.2014. - [facility] grew stre pyogenes. - ID conulted, will follow up on antibiotics  2 weeks of antibiotics. PICC line place will continue rocephin as and outpatient.  Anemia (01/26/2013) - fertile female.  - follow up as an outpatient.  - ferritin will be high if check 2/2 to infectious etiology. - ferrous sulfate TID.   Procedures:  - I&D 2.6.2014.  (i.e. Studies not automatically included, echos, thoracentesis, etc; not x-rays)  Consultations:  ID  ORtho  Discharge Exam: Filed Vitals:   01/28/13 0546  01/28/13 1431 01/28/13 2155 01/29/13 0544  BP: 100/65 100/52 97/52 102/65  Pulse: 102 95 77 94  Temp: 98.4 F (36.9 C) 98.2 F (36.8 C) 98.5 F (36.9 C) 99 F (37.2 C)  TempSrc: Oral Oral Oral Oral  Resp: 18 18 18 20   Height:      Weight:      SpO2: 94% 98% 98% 92%    General: A&O x3 Cardiovascular: RRR Respiratory: good air movement CTA B/L  Discharge Instructions  Discharge Orders   Future Orders Complete By Expires     Change dressing  As directed     Scheduling Instructions:      Dry gauze dressing change daily until no drainage, then may stop dressings    Diet - low sodium heart healthy  As directed     Increase activity slowly  As directed         Medication List    STOP taking these medications       cephALEXin 500 MG capsule  Commonly known as:  KEFLEX     doxycycline 100 MG tablet  Commonly known as:  VIBRA-TABS      TAKE these medications       acetaminophen 325 MG tablet  Commonly known as:  TYLENOL  Take 2 tablets (650 mg total) by mouth once.     dextrose 5 % SOLN 50 mL with cefTRIAXone 1 G SOLR 1 g  Inject 1 g into the vein daily.     indomethacin 75 MG CR capsule  Commonly known as:  INDOCIN SR  Take 1 capsule (75 mg total)  by mouth daily with breakfast.     oxyCODONE 5 MG immediate release tablet  Commonly known as:  Oxy IR/ROXICODONE  Take 1-2 tablets (5-10 mg total) by mouth every 3 (three) hours as needed.     pantoprazole 40 MG tablet  Commonly known as:  PROTONIX  Take 1 tablet (40 mg total) by mouth 2 (two) times daily.           Follow-up Information   Schedule an appointment as soon as possible for a visit with Janee Morn, DAVID A., MD. (Wednesday this next week)    Contact information:   [address] [place] [address] [phone]        The results of significant diagnostics from this hospitalization (including imaging, microbiology, ancillary and laboratory) are listed below for reference.     Significant Diagnostic Studies: No results found.  Microbiology: Recent Results (from the past 240 hour(s))  WOUND CULTURE     Status: None   Collection Time    01/27/13 12:00 AM      Result Value Range Status   Specimen Description WOUND RIGHT HAND   Final   Special Requests NONE   Final   Gram Stain     Final   Value: RARE WBC PRESENT, PREDOMINANTLY PMN     NO SQUAMOUS EPITHELIAL CELLS SEEN     NO ORGANISMS SEEN   Culture FEW GROUP A STREP (S.PYOGENES) ISOLATED   Final   Report Status 01/29/2013 FINAL   Final  ANAEROBIC CULTURE     Status: None   Collection Time    01/27/13 12:00 AM      Result Value Range Status   Specimen Description WOUND RIGHT HAND   Final   Special Requests NONE   Final   Gram Stain     Final   Value: RARE WBC PRESENT, PREDOMINANTLY PMN     NO SQUAMOUS EPITHELIAL CELLS SEEN     NO ORGANISMS SEEN   Culture     Final   Value: NO ANAEROBES ISOLATED; CULTURE IN PROGRESS FOR 5 DAYS   Report Status PENDING   Incomplete  GRAM STAIN     Status: None   Collection Time    01/27/13 12:00 AM      Result Value Range Status   Specimen Description WOUND RIGHT HAND   Final   Special Requests NONE   Final   Gram Stain     Final   Value: RARE WBC PRESENT,BOTH PMN AND MONONUCLEAR     NO ORGANISMS SEEN     CALLED TO J.ZHU RN 0056 01/26/2013 E.GADDY   Report Status 01/27/2013 FINAL   Final     Labs: Basic Metabolic Panel:  Recent Labs Lab 01/27/13 0540  NA 133*  K 3.8  CL 102  CO2 23  GLUCOSE 138*  BUN 9  CREATININE 0.66  CALCIUM 7.6*   Liver Function Tests: No results found for this basename: AST, ALT, ALKPHOS, BILITOT, PROT, ALBUMIN,  in the last 168 hours No results found for this basename: LIPASE, AMYLASE,  in the last 168 hours No results found for this basename: AMMONIA,  in the last 168 hours CBC:  Recent Labs Lab 01/27/13 0540 01/28/13 0550  WBC 21.9* 13.4*  HGB 10.6* 10.4*  HCT 31.2* 31.1*  MCV 87.6 88.4  PLT 169 160   Cardiac  Enzymes: No results found for this basename: CKTOTAL, CKMB, CKMBINDEX, TROPONINI,  in the last 168 hours BNP: BNP (last 3 results) No  results found for this basename: PROBNP,  in the last 8760 hours CBG: No results found for this basename: GLUCAP,  in the last 168 hours   Signed:  Marinda Elk  [facility] 01/29/2013, 8:58 AM

## 2013-01-30 NOTE — Progress Notes (Signed)
NCM faxed dc summary, order and facesheet to [facility] for IV abx scheduled for today. Contacted pt to make aware [facility] RN scheduled to come today at 5 pm to administer IV abx. Isidoro Donning RN CCM Case Mgmt phone [phone]

## 2013-01-31 LAB — ANAEROBIC CULTURE

## 2013-02-02 ENCOUNTER — Inpatient Hospital Stay: Payer: No Typology Code available for payment source | Admitting: Infectious Diseases

## 2013-02-09 ENCOUNTER — Telehealth: Payer: Self-pay | Admitting: *Deleted

## 2013-02-09 NOTE — Telephone Encounter (Signed)
Spoke w/ pt.  She lives in [place] and requests not to come to [facility] for HSFU.  Pt states that she had blood drawn on 02/07/13 by [facility].  Last dose of IV ceftriaxone is 02/12/13.  Pt requesting MD review lab results to determine if IV abx needs to continue; if PICC needs to be d/ced; and if she does need to come for HSFU at [facility].  RN called [facility] to receive recent labs.  To be faxed.

## 2013-02-10 NOTE — Telephone Encounter (Signed)
F/U phone call to [facility].  CRP and Sed rate were not drawn on 02/07/13, only cbc w/ diff.  RN requested that sed rate and CRP be drawn and results be faxed to [facility] for Dr. Blair Dolphin review.  [facility] pharmacy stated that nursing will be contacted to draw labs today.

## 2013-02-10 NOTE — Telephone Encounter (Signed)
Sed rate drawn 02/10/13 = 30.

## 2013-02-11 ENCOUNTER — Telehealth: Payer: Self-pay | Admitting: *Deleted

## 2013-02-11 NOTE — Telephone Encounter (Signed)
Dr. Orvan Falconer gave verbal order for PICC to be removed after last dose of IV abx.  RN spoke with Corrie Dandy at [facility] and gave order to remove PICC after last dose.  Pt stated that her hand was much improved.  She is starting therapy.  She is going to keep post-op visit on 02/14/13 w/ Dr. Mack Hook.  Pt verbalized that if Dr. Janee Morn wants her to see Dr. Orvan Falconer after his exam on Monday, Feb. 24 that she will keep an appt w/ Dr. Orvan Falconer.

## 2013-03-02 ENCOUNTER — Other Ambulatory Visit: Payer: Self-pay | Admitting: Orthopedic Surgery

## 2013-03-03 ENCOUNTER — Encounter: Payer: Self-pay | Admitting: Internal Medicine

## 2013-03-03 ENCOUNTER — Encounter (HOSPITAL_BASED_OUTPATIENT_CLINIC_OR_DEPARTMENT_OTHER): Payer: Self-pay | Admitting: *Deleted

## 2013-03-03 NOTE — Progress Notes (Signed)
Pt has had this hand infection over month-even had to have a PICC line in-this is out-she is still having drg but not on oral antibiotics-she says some days it is better and has call into dr Janee Morn to see if needs to delay surg.

## 2013-03-04 NOTE — H&P (Signed)
Michelle Allison is an 52 y.o. female.   Chief Complaint: right index finger wound s/p infection HPI: This patient has a large wound of the right IF over volar surface of P1 following severe ST infection.    Past Medical History  Diagnosis Date  . Headache     Past Surgical History  Procedure Laterality Date  . Tonsillectomy    . I&d extremity  01/26/2013    Procedure: IRRIGATION AND DEBRIDEMENT EXTREMITY;  Surgeon: Jodi Marble, MD;  Location: [facility] OR;  Service: Orthopedics;  Laterality: Right;    Family History  Problem Relation Age of Onset  . Lymphoma Other    Social History:  reports that she has never smoked. She does not have any smokeless tobacco history on file. She reports that she does not drink alcohol or use illicit drugs.  Allergies:  Allergies  Allergen Reactions  . Augmentin (Amoxicillin-Pot Clavulanate) Rash    No prescriptions prior to admission    No results found for this or any previous visit (from the past 48 hour(s)). No results found.  Review of Systems  All other systems reviewed and are negative.    Height 5\' 5"  (1.651 m), weight 78.926 kg (174 lb), last menstrual period 02/03/2013. Physical Exam  Constitutional:  WD, WN, NAD HEENT:  NCAT, EOMI Neuro/Psych:  Alert & oriented to person, place, and time; appropriate mood & affect Lymphatic: No generalized UE edema or lymphadenopathy Extremities / MSK:  Both UE are normal with respect to appearance, ranges of motion, joint stability, muscle strength/tone, sensation, & perfusion except as otherwise noted:  Swelling is diminished.  The incision is healing well.  The area on the volar and ulnar aspect of the index finger over the proximal phalanx now measures 20 x 18, with some areas that healthy granulation and others did have Snider he skin necrosis still clinging to it.  The index finger now can be flexed 3 cm from the palm.  Full extension is possible of all the digits.  Labs / X-rays:  No  radiographic studies obtained today.  Assessment:  Improving right hand and index finger flexor sheath infection--full-thickness skin necrosis on the volar aspect of the index finger  Plan: We discussed these findings and whether or not to proceed with skin grafting.  After a consequence will discussion regarding it.  We decided to proceed with full thickness skin grafting to the right index finger to be performed in the early part of next week.  This will involve excision of necrotic debris in preparation of the base along with full thickness skin grafting roughly 2 cm in diameter with the donor site from the upper inner arm.  The details of the operative procedure were discussed with the patient.  Questions were invited and answered.  In addition to the goal of the procedure, the risks of the procedure to include but not limited to bleeding; infection; damage to the nerves or blood vessels that could result in bleeding, numbness, weakness, chronic pain, and the need for additional procedures; stiffness; the need for revision surgery; and anesthetic risks, the worst of which is death, were reviewed.  No specific outcome was guaranteed or implied.  Informed consent was obtained.  Postoperative pain prescriptions and a followup appointment was not made.   THOMPSON, DAVID A. 03/04/2013, 5:58 PM

## 2013-03-07 ENCOUNTER — Encounter (HOSPITAL_BASED_OUTPATIENT_CLINIC_OR_DEPARTMENT_OTHER)
Admission: RE | Disposition: A | Discharge status: Home / Self Care | Payer: Self-pay | Source: Ambulatory Visit | Attending: Orthopedic Surgery

## 2013-03-07 ENCOUNTER — Ambulatory Visit (HOSPITAL_BASED_OUTPATIENT_CLINIC_OR_DEPARTMENT_OTHER)
Admission: RE | Admit: 2013-03-07 | Discharge: 2013-03-07 | Disposition: A | Discharge status: Home / Self Care | Payer: No Typology Code available for payment source | Source: Ambulatory Visit | Attending: Orthopedic Surgery | Admitting: Orthopedic Surgery

## 2013-03-07 ENCOUNTER — Encounter (HOSPITAL_BASED_OUTPATIENT_CLINIC_OR_DEPARTMENT_OTHER): Payer: Self-pay | Admitting: Certified Registered Nurse Anesthetist

## 2013-03-07 ENCOUNTER — Ambulatory Visit (HOSPITAL_BASED_OUTPATIENT_CLINIC_OR_DEPARTMENT_OTHER): Payer: No Typology Code available for payment source | Admitting: Certified Registered Nurse Anesthetist

## 2013-03-07 DIAGNOSIS — K219 Gastro-esophageal reflux disease without esophagitis: Secondary | ICD-10-CM | POA: Insufficient documentation

## 2013-03-07 DIAGNOSIS — L988 Other specified disorders of the skin and subcutaneous tissue: Secondary | ICD-10-CM | POA: Insufficient documentation

## 2013-03-07 DIAGNOSIS — M6789 Other specified disorders of synovium and tendon, multiple sites: Secondary | ICD-10-CM | POA: Insufficient documentation

## 2013-03-07 HISTORY — PX: I & D EXTREMITY: SHX5045

## 2013-03-07 HISTORY — PX: SKIN FULL THICKNESS GRAFT: SHX442

## 2013-03-07 SURGERY — IRRIGATION AND DEBRIDEMENT EXTREMITY
Anesthesia: General | Site: Finger | Laterality: Right | Wound class: Clean Contaminated

## 2013-03-07 MED ORDER — VANCOMYCIN HCL IN DEXTROSE 1-5 GM/200ML-% IV SOLN
1000.0000 mg | INTRAVENOUS | Status: AC
  Administered 2013-03-07: 1000 mg via INTRAVENOUS

## 2013-03-07 MED ORDER — LACTATED RINGERS IV SOLN: INTRAVENOUS | Status: DC

## 2013-03-07 MED ORDER — MEPERIDINE HCL 25 MG/ML IJ SOLN: 6.2500 mg | INTRAMUSCULAR | Status: DC | PRN

## 2013-03-07 MED ORDER — HYDROCODONE-ACETAMINOPHEN 5-325 MG PO TABS: 1.0000 | ORAL_TABLET | ORAL | Status: DC | PRN

## 2013-03-07 MED ORDER — FENTANYL CITRATE 0.05 MG/ML IJ SOLN
INTRAMUSCULAR | Status: DC | PRN
  Administered 2013-03-07 (×3): 25 ug via INTRAVENOUS
  Administered 2013-03-07: 50 ug via INTRAVENOUS

## 2013-03-07 MED ORDER — LACTATED RINGERS IV SOLN
INTRAVENOUS | Status: DC
Start: 2013-03-07 — End: 2013-03-07
  Administered 2013-03-07 (×2): via INTRAVENOUS

## 2013-03-07 MED ORDER — PROPOFOL 10 MG/ML IV BOLUS
INTRAVENOUS | Status: DC | PRN
  Administered 2013-03-07: 200 mg via INTRAVENOUS

## 2013-03-07 MED ORDER — ACETAMINOPHEN 10 MG/ML IV SOLN
1000.0000 mg | Freq: Once | INTRAVENOUS | Status: AC
  Administered 2013-03-07: 1000 mg via INTRAVENOUS

## 2013-03-07 MED ORDER — BUPIVACAINE-EPINEPHRINE PF 0.25-1:200000 % IJ SOLN
INTRAMUSCULAR | Status: DC | PRN
  Administered 2013-03-07: 10 mL

## 2013-03-07 MED ORDER — MIDAZOLAM HCL 5 MG/5ML IJ SOLN
INTRAMUSCULAR | Status: DC | PRN
  Administered 2013-03-07: 2 mg via INTRAVENOUS

## 2013-03-07 MED ORDER — ONDANSETRON HCL 4 MG/2ML IJ SOLN
INTRAMUSCULAR | Status: DC | PRN
  Administered 2013-03-07: 4 mg via INTRAVENOUS

## 2013-03-07 MED ORDER — HYDROMORPHONE HCL PF 1 MG/ML IJ SOLN
0.5000 mg | INTRAMUSCULAR | Status: DC | PRN
  Administered 2013-03-07 (×2): 0.5 mg via INTRAVENOUS

## 2013-03-07 MED ORDER — PROMETHAZINE HCL 25 MG/ML IJ SOLN: 6.2500 mg | INTRAMUSCULAR | Status: DC | PRN

## 2013-03-07 MED ORDER — OXYCODONE HCL 5 MG/5ML PO SOLN: 5.0000 mg | Freq: Once | ORAL | Status: DC | PRN

## 2013-03-07 MED ORDER — MIDAZOLAM HCL 2 MG/2ML IJ SOLN: 1.0000 mg | INTRAMUSCULAR | Status: DC | PRN

## 2013-03-07 MED ORDER — MIDAZOLAM HCL 2 MG/2ML IJ SOLN: 0.5000 mg | Freq: Once | INTRAMUSCULAR | Status: DC | PRN

## 2013-03-07 MED ORDER — LIDOCAINE HCL (CARDIAC) 20 MG/ML IV SOLN
INTRAVENOUS | Status: DC | PRN
  Administered 2013-03-07: 60 mg via INTRAVENOUS

## 2013-03-07 MED ORDER — FENTANYL CITRATE 0.05 MG/ML IJ SOLN
25.0000 ug | INTRAMUSCULAR | Status: DC | PRN
  Administered 2013-03-07: 50 ug via INTRAVENOUS

## 2013-03-07 MED ORDER — FENTANYL CITRATE 0.05 MG/ML IJ SOLN: 50.0000 ug | INTRAMUSCULAR | Status: DC | PRN

## 2013-03-07 MED ORDER — OXYCODONE HCL 5 MG PO TABS: 5.0000 mg | ORAL_TABLET | Freq: Once | ORAL | Status: DC | PRN

## 2013-03-07 MED ORDER — OXYCODONE-ACETAMINOPHEN 5-325 MG PO TABS: 1.0000 | ORAL_TABLET | ORAL | Status: DC | PRN

## 2013-03-07 MED ORDER — DEXAMETHASONE SODIUM PHOSPHATE 10 MG/ML IJ SOLN
INTRAMUSCULAR | Status: DC | PRN
  Administered 2013-03-07: 10 mg via INTRAVENOUS

## 2013-03-07 SURGICAL SUPPLY — 47 items
BANDAGE COBAN STERILE 2 (GAUZE/BANDAGES/DRESSINGS) IMPLANT
BANDAGE GAUZE ELAST BULKY 4 IN (GAUZE/BANDAGES/DRESSINGS) ×4 IMPLANT
BLADE MINI RND TIP GREEN BEAV (BLADE) IMPLANT
BLADE SURG 15 STRL LF DISP TIS (BLADE) ×1 IMPLANT
BLADE SURG 15 STRL SS (BLADE) ×1
BNDG COHESIVE 3X5 TAN STRL LF (GAUZE/BANDAGES/DRESSINGS) IMPLANT
BNDG COHESIVE 4X5 TAN STRL (GAUZE/BANDAGES/DRESSINGS) ×2 IMPLANT
BNDG ESMARK 4X9 LF (GAUZE/BANDAGES/DRESSINGS) ×2 IMPLANT
CHLORAPREP W/TINT 26ML (MISCELLANEOUS) ×2 IMPLANT
CLOTH BEACON ORANGE TIMEOUT ST (SAFETY) ×2 IMPLANT
CORDS BIPOLAR (ELECTRODE) ×2 IMPLANT
COTTONBALL LRG STERILE PKG (GAUZE/BANDAGES/DRESSINGS) ×2 IMPLANT
COVER MAYO STAND STRL (DRAPES) ×2 IMPLANT
COVER TABLE BACK 60X90 (DRAPES) ×2 IMPLANT
CUFF TOURNIQUET SINGLE 18IN (TOURNIQUET CUFF) IMPLANT
DEPRESSOR TONGUE BLADE STERILE (MISCELLANEOUS) IMPLANT
DRAPE EXTREMITY T 121X128X90 (DRAPE) ×2 IMPLANT
DRAPE SURG 17X23 STRL (DRAPES) ×2 IMPLANT
DRSG EMULSION OIL 3X3 NADH (GAUZE/BANDAGES/DRESSINGS) ×2 IMPLANT
GAUZE XEROFORM 1X8 LF (GAUZE/BANDAGES/DRESSINGS) ×2 IMPLANT
GLOVE BIO SURGEON STRL SZ7.5 (GLOVE) ×2 IMPLANT
GLOVE BIOGEL PI IND STRL 8 (GLOVE) ×1 IMPLANT
GLOVE BIOGEL PI INDICATOR 8 (GLOVE) ×1
GOWN PREVENTION PLUS XLARGE (GOWN DISPOSABLE) ×2 IMPLANT
NEEDLE HYPO 25X1 1.5 SAFETY (NEEDLE) IMPLANT
NS IRRIG 1000ML POUR BTL (IV SOLUTION) ×2 IMPLANT
PACK BASIN DAY SURGERY FS (CUSTOM PROCEDURE TRAY) ×2 IMPLANT
PAD CAST 4YDX4 CTTN HI CHSV (CAST SUPPLIES) ×1 IMPLANT
PADDING CAST ABS 4INX4YD NS (CAST SUPPLIES) ×1
PADDING CAST ABS COTTON 4X4 ST (CAST SUPPLIES) ×1 IMPLANT
PADDING CAST COTTON 4X4 STRL (CAST SUPPLIES) ×1
RUBBERBAND STERILE (MISCELLANEOUS) IMPLANT
SPLINT PLASTER CAST XFAST 3X15 (CAST SUPPLIES) IMPLANT
SPLINT PLASTER XTRA FASTSET 3X (CAST SUPPLIES)
SPONGE GAUZE 4X4 12PLY (GAUZE/BANDAGES/DRESSINGS) ×2 IMPLANT
STAPLER VISISTAT 35W (STAPLE) IMPLANT
STOCKINETTE 4X48 STRL (DRAPES) ×2 IMPLANT
SUT CHROMIC 4 0 P 3 18 (SUTURE) ×2 IMPLANT
SUT CHROMIC 5 0 P 3 (SUTURE) ×2 IMPLANT
SUT SILK 3 0 SH CR/8 (SUTURE) ×2 IMPLANT
SUT VICRYL RAPID 5 0 P 3 (SUTURE) IMPLANT
SUT VICRYL RAPIDE 4/0 PS 2 (SUTURE) IMPLANT
SYR BULB 3OZ (MISCELLANEOUS) ×2 IMPLANT
SYRINGE 10CC LL (SYRINGE) IMPLANT
TOWEL OR 17X24 6PK STRL BLUE (TOWEL DISPOSABLE) ×4 IMPLANT
TOWEL OR NON WOVEN STRL DISP B (DISPOSABLE) ×2 IMPLANT
UNDERPAD 30X30 INCONTINENT (UNDERPADS AND DIAPERS) ×2 IMPLANT

## 2013-03-07 NOTE — H&P (View-Only) (Signed)
Pt has had this hand infection over month-even had to have a PICC line in-this is out-she is still having drg but not on oral antibiotics-she says some days it is better and has call into dr Janee Morn to see if needs to delay surg.

## 2013-03-07 NOTE — Anesthesia Procedure Notes (Signed)
Procedure Name: LMA Insertion Date/Time: 03/07/2013 9:10 AM Performed by: Athleen Feltner D Pre-anesthesia Checklist: Patient identified, Emergency Drugs available, Suction available and Patient being monitored Patient Re-evaluated:Patient Re-evaluated prior to inductionOxygen Delivery Method: Circle System Utilized Preoxygenation: Pre-oxygenation with 100% oxygen Intubation Type: IV induction Ventilation: Mask ventilation without difficulty LMA: LMA inserted LMA Size: 4.0 Number of attempts: 1 Airway Equipment and Method: bite block Placement Confirmation: positive ETCO2 Tube secured with: Tape Dental Injury: Teeth and Oropharynx as per pre-operative assessment

## 2013-03-07 NOTE — Op Note (Signed)
03/07/2013  9:10 AM  PATIENT:  Michelle Allison  52 y.o. female  PRE-OPERATIVE DIAGNOSIS:  Right index finger wound   POST-OPERATIVE DIAGNOSIS:  Same  PROCEDURE:  Right index finger FTSG from Right arm donor site  SURGEON: Cliffton Asters. Janee Morn, MD  PHYSICIAN ASSISTANT: None  ANESTHESIA:  general  SPECIMENS:  None  DRAINS:   None  PREOPERATIVE INDICATIONS:  Michelle Allison is a  52 y.o. female with a diagnosis of right index finger wound following infection who elected for surgical management  The risks benefits and alternatives were discussed with the patient preoperatively including but not limited to the risks of infection, bleeding, nerve injury, cardiopulmonary complications, the need for revision surgery, among others, and the patient verbalized understanding and consented to proceed.  OPERATIVE IMPLANTS: none  OPERATIVE FINDINGS: Once the wound was debrided, it was 15 x 20 mm in its longest dimension with its longest axis on the volar surface of the index finger over the proximal phalanx  OPERATIVE PROCEDURE:  After receiving prophylactic antibiotics, the patient was escorted to the operative theatre and placed in a supine position. General anesthesia was administered.   A surgical "time-out" was performed during which the planned procedure, proposed operative site, and the correct patient identity were compared to the operative consent and agreement confirmed by the circulating nurse according to current facility policy.  Following application of a tourniquet to the operative extremity, the exposed skin was prepped with Chloraprep and draped in the usual sterile fashion.    The recipient site was debrided with scissors and curettes to a nice healthy granulation base with firmly adherent skin edges. A template was taken and transferred to the inner aspect of the arm. Around the graft, additional lines were drawn to create smooth tapering distal and proximal edges.  This was  anesthetized with a mixture of lidocaine and Marcaine bearing epinephrine. The skin graft was then harvested carefully as a thin full- thickness graft without subcutaneous tissue. It was trephinated. The remainder of the donor site was prepared for ultimate closure with excision of some of subcutaneous tissues. At the recipient site, the skin graft was then inset and secured with a mixture of 4-0 chromic sutures and 3-0 silk sutures to be used for a bolster dressing. A bolster dressing was applied consisting of soaked gauze inside of Xeroform. The recipient site was closed with 4-0 Vicryl Rapide deep dermal buried sutures and running horizontal mattress suture. It was dressed with gauze and Tegaderm. A bulky hand dressing was applied to the hand and forearm with a plaster component placing the MP joints down and the IP joint into extension to include the index. She was awakened and taken to recovery in stable condition breathing spontaneously  DISPOSITION: She'll be discharged home today with typical postop instructions returning on Friday or Monday for evaluation of the skin graft.

## 2013-03-07 NOTE — Anesthesia Preprocedure Evaluation (Signed)
Anesthesia Evaluation  Patient identified by MRN, date of birth, ID band Patient awake    Reviewed: Allergy & Precautions, H&P , NPO status , Patient's Chart, lab work & pertinent test results  History of Anesthesia Complications (+) PONVNegative for: history of anesthetic complications  Airway Mallampati: I TM Distance: >3 FB Neck ROM: Full    Dental  (+) Teeth Intact and Dental Advisory Given   Pulmonary neg pulmonary ROS,  breath sounds clear to auscultation  Pulmonary exam normal       Cardiovascular negative cardio ROS  Rhythm:Regular Rate:Normal     Neuro/Psych  Headaches, negative psych ROS   GI/Hepatic Neg liver ROS, GERD-  Controlled,  Endo/Other  negative endocrine ROS  Renal/GU negative Renal ROS     Musculoskeletal   Abdominal   Peds  Hematology negative hematology ROS (+)   Anesthesia Other Findings   Reproductive/Obstetrics LMP presently                           Anesthesia Physical Anesthesia Plan  ASA: I  Anesthesia Plan: General   Post-op Pain Management:    Induction: Intravenous  Airway Management Planned: LMA  Additional Equipment:   Intra-op Plan:   Post-operative Plan:   Informed Consent: I have reviewed the patients History and Physical, chart, labs and discussed the procedure including the risks, benefits and alternatives for the proposed anesthesia with the patient or authorized representative who has indicated his/her understanding and acceptance.   Dental advisory given  Plan Discussed with: CRNA and Surgeon  Anesthesia Plan Comments: (Plan routine monitors, GA- LMA OK)        Anesthesia Quick Evaluation

## 2013-03-07 NOTE — Transfer of Care (Signed)
Immediate Anesthesia Transfer of Care Note  Patient: Michelle Allison  Procedure(s) Performed: Procedure(s) with comments: RIGHT INDEX FINGER WOUND DEBRIDEMENT (Right) - FULL THICKNESS SKIN GRAFT SKIN GRAFT FULL THICKNESS (Right)  Patient Location: PACU  Anesthesia Type:General  Level of Consciousness: awake and patient cooperative  Airway & Oxygen Therapy: Patient Spontanous Breathing and Patient connected to face mask oxygen  Post-op Assessment: Report given to PACU RN and Post -op Vital signs reviewed and stable  Post vital signs: Reviewed and stable  Complications: No apparent anesthesia complications

## 2013-03-07 NOTE — Anesthesia Postprocedure Evaluation (Signed)
  Anesthesia Post-op Note  Patient: Michelle Allison  Procedure(s) Performed: Procedure(s) with comments: RIGHT INDEX FINGER WOUND DEBRIDEMENT (Right) - FULL THICKNESS SKIN GRAFT SKIN GRAFT FULL THICKNESS (Right)  Patient Location: PACU  Anesthesia Type:General  Level of Consciousness: awake, alert , oriented and patient cooperative  Airway and Oxygen Therapy: Patient Spontanous Breathing  Post-op Pain: none  Post-op Assessment: Post-op Vital signs reviewed, Patient's Cardiovascular Status Stable, Respiratory Function Stable, Patent Airway, No signs of Nausea or vomiting and Pain level controlled  Post-op Vital Signs: Reviewed and stable  Complications: No apparent anesthesia complications

## 2013-03-07 NOTE — Interval H&P Note (Signed)
History and Physical Interval Note:  03/07/2013 9:00 AM  Michelle Allison  has presented today for surgery, with the diagnosis of RIGHT INDEX FINGER WOUND  The various methods of treatment have been discussed with the patient and family. After consideration of risks, benefits and other options for treatment, the patient has consented to  Procedure(s) with comments: RIGHT INDEX FINGER WOUND DEBRIDEMENT (Right) - FULL THICKNESS SKIN GRAFT SKIN GRAFT FULL THICKNESS (Right) as a surgical intervention .  The patient's history has been reviewed, patient examined, no change in status, stable for surgery.  I have reviewed the patient's chart and labs.  Questions were answered to the patient's satisfaction.     [name] A.

## 2013-03-08 ENCOUNTER — Encounter (HOSPITAL_BASED_OUTPATIENT_CLINIC_OR_DEPARTMENT_OTHER): Payer: Self-pay | Admitting: Orthopedic Surgery

## 2013-06-21 ENCOUNTER — Encounter: Payer: Self-pay | Admitting: Neurology

## 2013-06-22 ENCOUNTER — Ambulatory Visit: Payer: Self-pay | Admitting: Neurology

## 2018-05-12 DIAGNOSIS — L570 Actinic keratosis: Secondary | ICD-10-CM | POA: Diagnosis not present

## 2018-05-12 DIAGNOSIS — L819 Disorder of pigmentation, unspecified: Secondary | ICD-10-CM | POA: Diagnosis not present

## 2019-05-17 DIAGNOSIS — D239 Other benign neoplasm of skin, unspecified: Secondary | ICD-10-CM | POA: Diagnosis not present

## 2019-05-17 DIAGNOSIS — L570 Actinic keratosis: Secondary | ICD-10-CM | POA: Diagnosis not present

## 2019-05-17 DIAGNOSIS — L819 Disorder of pigmentation, unspecified: Secondary | ICD-10-CM | POA: Diagnosis not present

## 2019-06-09 DIAGNOSIS — G43709 Chronic migraine without aura, not intractable, without status migrainosus: Secondary | ICD-10-CM | POA: Diagnosis not present

## 2019-06-09 DIAGNOSIS — E039 Hypothyroidism, unspecified: Secondary | ICD-10-CM | POA: Diagnosis not present

## 2019-06-09 DIAGNOSIS — F331 Major depressive disorder, recurrent, moderate: Secondary | ICD-10-CM | POA: Diagnosis not present

## 2019-06-09 DIAGNOSIS — Z6833 Body mass index (BMI) 33.0-33.9, adult: Secondary | ICD-10-CM | POA: Diagnosis not present

## 2019-06-09 DIAGNOSIS — J301 Allergic rhinitis due to pollen: Secondary | ICD-10-CM | POA: Diagnosis not present

## 2019-06-29 ENCOUNTER — Encounter: Payer: Self-pay | Admitting: Allergy & Immunology

## 2019-06-29 ENCOUNTER — Other Ambulatory Visit: Payer: Self-pay

## 2019-06-29 ENCOUNTER — Ambulatory Visit (INDEPENDENT_AMBULATORY_CARE_PROVIDER_SITE_OTHER): Payer: BC Managed Care – PPO | Admitting: Allergy & Immunology

## 2019-06-29 VITALS — BP 100/70 | HR 90 | Temp 97.2°F | Resp 16 | Ht 65.5 in | Wt 200.0 lb

## 2019-06-29 DIAGNOSIS — T7819XD Other adverse food reactions, not elsewhere classified, subsequent encounter: Secondary | ICD-10-CM

## 2019-06-29 DIAGNOSIS — R51 Headache: Secondary | ICD-10-CM | POA: Diagnosis not present

## 2019-06-29 DIAGNOSIS — T781XXD Other adverse food reactions, not elsewhere classified, subsequent encounter: Secondary | ICD-10-CM | POA: Diagnosis not present

## 2019-06-29 DIAGNOSIS — G8929 Other chronic pain: Secondary | ICD-10-CM

## 2019-06-29 DIAGNOSIS — J3089 Other allergic rhinitis: Secondary | ICD-10-CM | POA: Diagnosis not present

## 2019-06-29 NOTE — Progress Notes (Signed)
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 Michelle Allison presents for an evaluation of chronic headaches that have been ongoing for more than 40 years.  She did have a history of allergies when she was a teenager but these seem to resolved over time, possibly due to her 2 years of allergen immunotherapy.  However, her headaches continue.  They do not seem to be associated with any particular exposure aside from some random foods, including Little Debbie snacks.  She is requesting entire food panel.  We did discuss the high false positive rate and she decided to go ahead with that anyway.  She did have some mild reactivity's, although they are not large enough for me to consider them true IgE mediated allergies.  I do not think an epinephrine autoinjector is indicated.  I did recommend that she avoid these foods for 2 to 4 weeks to see if they improve her headaches at all.  Her environmental allergy testing was positive only to a minor indoor mold mix and on intradermal's only, so I do not think her headaches are related to allergies.  Regardless, we are going to treat her with a nasal steroid to see if this provides any relief of her symptoms.  She can certainly call us if she likes the no spray and we could send it in.  Plan/Recommendations:   1. Chronic headaches - Testing today showed: positive only to a minor indoor mold mix, otherwise negative.  - Copy of test results provided.  - Start taking: Xhance (fluticasone) 1-2 sprays per nostril twice daily  - I am not convinced that a nose spray will help with the headaches, but consistent use for a couple of weeks might be worth a shot (sample provided).  - You can use an extra dose of the antihistamine, if needed,  for breakthrough symptoms.  - Consider nasal saline rinses 1-2 times daily to remove allergens from the nasal cavities as well as help with mucous clearance (this is especially helpful to do before the nasal sprays are given)  2. Adverse food reaction - Testing was only slightly positive to casein (part of cow's milk), shellfish mix, and tree nuts. - Try avoiding these to see how you do with reards to your headaches. - If there is not noticeable improvement, I would add these back to your diet.   3. Return in about 3 months (around 09/29/2019). This can be an in-person, a virtual Webex or a telephone follow up visit.   Subjective:   Michelle Allison is a 58 y.o. female presenting today for evaluation of  Chief Complaint  Patient presents with  . Headache    sinus  . Allergic Rhinitis     Michelle Allison has a history of the following: Patient Active Problem List   Diagnosis Date Noted  . Infection of hand 01/28/2013  . Group A streptococcal infection 01/26/2013  . Headache(784.0) 01/26/2013  . Anemia 01/26/2013    History obtained from: chart review and patient.  Michelle Allison was referred by Richardean Chimeraaniel, Terry G, MD.     Michelle Allison is a 58 y.o. female presenting for an evaluation of chronic headaches. In particular, she is concerned that foods might be related to these headaches and is "looking  for some answers".   She reports that she has had a constant headache since puberty around age 21. She had testing done when she was that age and was positive to multiple environmental allergens as well as several foods. She avoided a lot of foods and had shots for years without improvement in her symptoms. She had four shots per week and these were not helpful at all. She was on shots for a period of two years or so without any improvement at all. She does not think that she has allergies related to her headaches but she wanted to make sure to rule this out as a cause of her symptoms.   She  reports that her headache is around 4-5 out of 10 constantly. She is going to see a Insurance account manager on July 22nd via Zoom. She has tried multiple migraine medications without much improvement. She has tried different weights and food regimens without improvement.   Certain items to trigger her symptoms and make them worse. Little Debbie cakes make it worse. But she just wants to have allergy testing done to see if there is something that she can eliminate to help.  She does not use nose sprays at all. She feels that she does not need them at all. She did have a tonsillectomy when she was younger.    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 She is home schooling a 56yo and a 56yo and an 56yo. They are all adopted. The 56yo is Special Needs and from [place]. Her husband works as a Veterinary surgeon and works at [employer].   Otherwise, there is no history of other atopic diseases, including asthma, drug allergies, stinging insect allergies, eczema, urticaria or contact dermatitis. There is no significant infectious history. Vaccinations are up to date.    Past Medical History: Patient Active Problem List   Diagnosis Date Noted  . Infection of hand 01/28/2013  . Group A streptococcal infection 01/26/2013  . Headache(784.0) 01/26/2013  . Anemia 01/26/2013    Medication List:  Allergies as of 06/29/2019      Reactions   Augmentin [amoxicillin-pot Clavulanate] Rash      Medication List       Accurate as of June 29, 2019 12:05 PM. If you have any questions, ask your nurse or doctor.        STOP taking these medications   ferrous sulfate 325 (65 FE) MG tablet Commonly known as: FerrouSul Stopped by: Alfonse Spruce, MD   oxyCODONE 5 MG immediate release tablet Commonly known as: Oxy IR/ROXICODONE Stopped by: Alfonse Spruce, MD   pantoprazole 40 MG tablet Commonly known as: PROTONIX Stopped by: Alfonse Spruce, MD     TAKE these medications    acetaminophen 325 MG tablet Commonly known as: TYLENOL Take 2 tablets (650 mg total) by mouth once.   citalopram 20 MG tablet Commonly known as: CELEXA TAKE 1 TABLET BY MOUTH ONCE DAILY FOR DEPRESSION   levothyroxine 75 MCG tablet Commonly known as: SYNTHROID TAKE 1 TABLET BY MOUTH IN THE MORNING FOR HYPOTHYROIDISM   nortriptyline 10 MG capsule Commonly known as: PAMELOR Take 10 mg by mouth at bedtime. Take three(3) tablets at bedtime       Birth History: non-contributory  Developmental History: non-contributory  Past Surgical History: Past Surgical History:  Procedure Laterality Date  . I&D EXTREMITY  01/26/2013   Procedure: IRRIGATION AND DEBRIDEMENT EXTREMITY;  Surgeon: Jodi Marble, MD;  Location: [facility];  Service: Orthopedics;  Laterality: Right;  . I&D EXTREMITY Right 03/07/2013   Procedure: RIGHT INDEX FINGER WOUND DEBRIDEMENT;  Surgeon: Jolyn Nap, MD;  Location: [facility];  Service: Orthopedics;  Laterality: Right;  FULL THICKNESS SKIN GRAFT  . SKIN FULL THICKNESS GRAFT Right 03/07/2013   Procedure: SKIN GRAFT FULL THICKNESS;  Surgeon: Jolyn Nap, MD;  Location: [facility];  Service: Orthopedics;  Laterality: Right;  . TONSILLECTOMY       Family History: Family History  Problem Relation Age of Onset  . Arthritis Father   . Headache Brother   . Lymphoma Other      Social History: Aamira lives at home with her family. She has a husband and three adopted children. They live in a house that was built in 1999. She has carpeting in the main living areas and wood in the bedrooms. They have electric heating and central cooling with window units. They have one indoor cat. There are horses, chicken, rabbit, dogs, and cats outside of the home. There are no dust mite coverings in the bedding. There is no tobacco exposure in the home. 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     Review  of Systems  Constitutional: Negative.  Negative for fever, malaise/fatigue and weight loss.  HENT: Negative.  Negative for congestion, ear discharge and ear pain.   Eyes: Negative for pain, discharge and redness.  Respiratory: Negative for cough, sputum production, shortness of breath and wheezing.   Cardiovascular: Negative.  Negative for chest pain and palpitations.  Gastrointestinal: Negative for abdominal pain and heartburn.  Skin: Negative.  Negative for itching and rash.  Neurological: Negative for dizziness and headaches.  Endo/Heme/Allergies: Negative for environmental allergies. Does not bruise/bleed easily.       Objective:   Blood pressure 100/70, pulse 90, temperature (!) 97.2 F (36.2 C), temperature source Tympanic, resp. rate 16, height 5' 5.5" (1.664 m), weight 200 lb (90.7 kg), SpO2 97 %. Body mass index is 32.78 kg/m.   Physical Exam:   Physical Exam  Constitutional: She appears well-developed.  Very talkative female.   HENT:  Head: Normocephalic and atraumatic.  Right Ear: Tympanic membrane, external ear and ear canal normal. No drainage, swelling or tenderness. Tympanic membrane is not injected, not scarred, not erythematous, not retracted and not bulging.  Left Ear: Tympanic membrane, external ear and ear canal normal. No drainage, swelling or tenderness. Tympanic membrane is not injected, not scarred, not erythematous, not retracted and not bulging.  Nose: No mucosal edema, rhinorrhea, nasal deformity or septal deviation. No epistaxis. Right sinus exhibits no maxillary sinus tenderness and no frontal sinus tenderness. Left sinus exhibits no maxillary sinus tenderness and no frontal sinus tenderness.  Mouth/Throat: Uvula is midline and oropharynx is clear and moist. Mucous membranes are not pale and not dry.  Eyes: Pupils are equal, round, and reactive to light. Conjunctivae and EOM are normal. Right eye exhibits no chemosis and no discharge. Left eye exhibits  no chemosis and no discharge. Right conjunctiva is not injected. Left conjunctiva is not injected.  Cardiovascular: Normal rate, regular rhythm and normal heart sounds.  Respiratory: Effort normal and breath sounds normal. No accessory muscle usage. No tachypnea. No respiratory distress. She has no wheezes. She has no rhonchi. She has no rales. She exhibits no tenderness.  GI: There is no abdominal tenderness. There is no rebound and no guarding.  Lymphadenopathy:  Head (right side): No submandibular, no tonsillar and no occipital adenopathy present.       Head (left side): No submandibular, no tonsillar and no occipital adenopathy present.    She has no cervical adenopathy.  Neurological: She is alert.  Skin: No abrasion, no petechiae and no rash noted. Rash is not papular, not vesicular and not urticarial. No erythema. No pallor.  Psychiatric: She has a normal mood and affect.     Diagnostic studies:     Allergy Studies:    Airborne Adult Perc - 06/29/19 1033    Time Antigen Placed  1033    Allergen Manufacturer  Waynette Buttery    Location  Back    Number of Test  59    Panel 1  Select    1. Control-Buffer 50% Glycerol  Negative    2. Control-Histamine 1 mg/ml  2+    3. Albumin saline  Negative    4. Bahia  Negative    5. French Southern Territories  Negative    6. Johnson  Negative    7. Kentucky Blue  Negative    8. Meadow Fescue  Negative    9. Perennial Rye  Negative    10. Sweet Vernal  Negative    11. Timothy  Negative    12. Cocklebur  Negative    13. Burweed Marshelder  Negative    14. Ragweed, short  Negative    15. Ragweed, Giant  Negative    16. Plantain,  English  Negative    17. Lamb's Quarters  Negative    18. Sheep Sorrell  Negative    19. Rough Pigweed  Negative    20. Marsh Elder, Rough  Negative    21. Mugwort, Common  Negative    22. Ash mix  Negative    23. Birch mix  Negative    24. Beech American  Negative    25. Box, Elder  Negative    26. Cedar, red  Negative     27. Cottonwood, Guinea-Bissau  Negative    28. Elm mix  Negative    29. Hickory mix  Negative    30. Maple mix  Negative    31. Oak, Guinea-Bissau mix  Negative    32. Pecan Pollen  Negative    33. Pine mix  Negative    34. Sycamore Eastern  Negative    35. Walnut, Black Pollen  Negative    36. Alternaria alternata  Negative    37. Cladosporium Herbarum  Negative    38. Aspergillus mix  Negative    39. Penicillium mix  Negative    40. Bipolaris sorokiniana (Helminthosporium)  Negative    41. Drechslera spicifera (Curvularia)  Negative    42. Mucor plumbeus  Negative    43. Fusarium moniliforme  Negative    44. Aureobasidium pullulans (pullulara)  Negative    45. Rhizopus oryzae  Negative    46. Botrytis cinera  Negative    47. Epicoccum nigrum  Negative    48. Phoma betae  Negative    49. Candida Albicans  Negative    50. Trichophyton mentagrophytes  Negative    51. Mite, D Farinae  5,000 AU/ml  Negative    52. Mite, D Pteronyssinus  5,000 AU/ml  Negative    53. Cat Hair 10,000 BAU/ml  Negative    54.  Dog Epithelia  Negative    55. Mixed Feathers  Negative    56. Horse Epithelia  Negative    57.  Cockroach, German  Negative    58. Mouse  Negative    59. Tobacco Leaf  Negative     Intradermal - 06/29/19 1120    Time Antigen Placed  1120    Allergen Manufacturer  Greer    Location  Arm    Number of Test  15    Intradermal  Select    Control  Negative    French Southern TerritoriesBermuda  Negative    Johnson  Negative    7 Grass  Negative    Ragweed mix  Negative    Weed mix  Negative    Tree mix  Negative    Mold 1  Negative    Mold 2  Negative    Mold 3  Negative    Mold 4  2+    Cat  Negative    Dog  Negative    Cockroach  Negative    Mite mix  Negative     Food Adult Perc - 06/29/19 1000    Time Antigen Placed  1034    Allergen Manufacturer  Greer    Location  Back    Number of allergen test  72    Panel 2  Select     Control-buffer 50% Glycerol  Negative    Control-Histamine 1 mg/ml  2+     1. Peanut  Negative    2. Soybean  Negative    3. Wheat  Negative    4. Sesame  Negative    5. Milk, cow  Negative    6. Egg White, Chicken  Negative    7. Casein  --   +/-   8. Shellfish Mix  --   +/-   9. Fish Mix  Negative    10. Cashew  --   +/-   11. Pecan Food  --   +/-   12. Walnut Food  --   +/-   13. Almond  Negative    14. Hazelnut  --   +/-   15. Eston[place] nut  Negative    16. Coconut  Negative    17. Pistachio  Negative    18. Catfish  Negative    19. Bass  Negative    20. Trout  Negative    21. Tuna  Negative    22. Salmon  Negative    23. Flounder  Negative    24. Codfish  Negative    25. Shrimp  Negative    26. Crab  Negative    27. Lobster  Negative    28. Oyster  Negative    29. Scallops  Negative    30. Barley  Negative    31. Oat   Negative    32. Rye   Negative    33. Hops  Negative    34. Rice  Negative    35. Cottonseed  Negative    36. Saccharomyces Cerevisiae   Negative    37. Pork  Negative    38. Malawiurkey Meat  Negative    39. Chicken Meat  Negative    40. Beef  Negative    41. Lamb  Negative    42. Tomato  Negative    43. White Potato  Negative    44. Sweet Potato  Negative    45. Pea, Green/English  Negative    46. Navy Bean  Negative    47. Mushrooms  Negative    48. Avocado  Negative    49. Onion  Negative  50. Cabbage  Negative    51. Carrots  Negative    52. Celery  Negative    53. Corn  Negative    54. Cucumber  Negative    55. Grape (White seedless)  Negative    56. Orange   Negative    57. Banana  Negative    58. Apple  Negative    59. Peach  Negative    60. Strawberry  Negative    61. Cantaloupe  Negative    62. Watermelon  Negative    63. Pineapple  Negative    64. Chocolate/Cacao bean  Negative    65. Karaya Gum  Negative    66. Acacia (Arabic Gum)  Negative    67. Cinnamon  Negative    68. Nutmeg  Negative    69. Ginger  Negative    70. Garlic  Negative    71. Pepper, black  Negative    72. Mustard   Negative       Allergy testing results were read and interpreted by myself, documented by clinical staff.         Malachi BondsJoel Syncere Eble, MD [facility]

## 2019-06-29 NOTE — Patient Instructions (Addendum)
1. Chronic headaches - Testing today showed: positive only to a minor indoor mold mix, otherwise negative.  - Copy of test results provided.  - Start taking: Xhance (fluticasone) 1-2 sprays per nostril twice daily  - I am not convinced that a nose spray will help with the headaches, but consistent use for a couple of weeks might be worth a shot (sample provided).  - You can use an extra dose of the antihistamine, if needed, for breakthrough symptoms.  - Consider nasal saline rinses 1-2 times daily to remove allergens from the nasal cavities as well as help with mucous clearance (this is especially helpful to do before the nasal sprays are given)  2. Adverse food reaction - Testing was only slightly positive to casein (part of cow's milk), shellfish mix, and tree nuts. - Try avoiding these to see how you do with reards to your headaches. - If there is not noticeable improvement, I would add these back to your diet.   3. Return in about 3 months (around 09/29/2019). This can be an in-person, a virtual Webex or a telephone follow up visit.   Please inform us of any Emergency Department visits, hospitalizations, or changes in symptoms. Call us before going to the ED for breathing or allergy symptoms since we might be able to fit you in for a sick visit. Feel free to contact us anytime with any questions, problems, or concerns.  It was a pleasure to meet you today!  Websites that have reliable patient information: 1. American Academy of Asthma, Allergy, and Immunology: www.aaaai.org 2. Food Allergy Research and Education (FARE): foodallergy.org 3. Mothers of Asthmatics: http://www.asthmacommunitynetwork.org 4. American College of Allergy, Asthma, and Immunology: www.acaai.org  "Like" [place] on Facebook and Instagram for our latest updates!      Make sure you are registered to vote! If you have moved or changed any of your contact information, you will need to get this updated before voting!  In  some cases, you MAY be able to register to vote online: [email or web address]    Voter ID laws are NOT going into effect for the General Election in November 2020! DO NOT let this stop you from exercising your right to vote!   Absentee voting is the SAFEST way to vote during the coronavirus pandemic!   Download and print an absentee ballot request form at [email or web address] or you can scan the QR code below with your smart phone:      More information on absentee ballots can be found here: [email or web address]    Control of Mold Allergen   Mold and fungi can grow on a variety of surfaces provided certain temperature and moisture conditions exist.  Outdoor molds grow on plants, decaying vegetation and soil.  The major outdoor mold, Alternaria and Cladosporium, are found in very high numbers during hot and dry conditions.  Generally, a late Summer - Fall peak is seen for common outdoor fungal spores.  Rain will temporarily lower outdoor mold spore count, but counts rise rapidly when the rainy period ends.  The most important indoor molds are Aspergillus and Penicillium.  Dark, humid and poorly ventilated basements are ideal sites for mold growth.  The next most common sites of mold growth are the bathroom and the kitchen.   Indoor (Perennial) Mold Control   Positive indoor molds via skin testing: Fusarium, Aureobasidium (Pullulara) and Rhizopus  1. Maintain humidity below 50%. 2. Clean washable surfaces with 5% bleach solution. 3. Remove sources e.g. contaminated carpets.

## 2019-06-30 DIAGNOSIS — Z0000 Encounter for general adult medical examination without abnormal findings: Secondary | ICD-10-CM | POA: Diagnosis not present

## 2019-06-30 DIAGNOSIS — Z6834 Body mass index (BMI) 34.0-34.9, adult: Secondary | ICD-10-CM | POA: Diagnosis not present

## 2019-07-12 NOTE — Progress Notes (Signed)
Virtual Visit via Video Note The purpose of this virtual visit is to provide medical care while limiting exposure to the novel coronavirus.    Consent was obtained for video visit:  Yes.   Answered questions that patient had about telehealth interaction:  Yes.   I discussed the limitations, risks, security and privacy concerns of performing an evaluation and management service by telemedicine. I also discussed with the patient that there may be a patient responsible charge related to this service. The patient expressed understanding and agreed to proceed.  Pt location: Home Physician Location: office Name of referring provider:  Caryl Bis, MD I connected with Michelle Allison at patients initiation/request on 07/13/2019 at 11:10 AM EDT by video enabled telemedicine application and verified that I am speaking with the correct person using two identifiers. Pt MRN:  [id number] Pt DOB:  04/06/1961 Video Participants:  Michelle Allison   History of Present Illness:  Michelle Allison is a 58 year old woman who presents for headaches.  History supplemented by referring provider note.  She has had daily persistent headaches since she was a teenager, during puberty.  She had CT head at the time which was unremarkable.  They are in the back of her head but also side and front.  It can fluctuate from 4 to 10.  It can be a squeezing pain.  Not a thunderclap headache or worst headache of her life.  Severe fluctuations vary in frequency, typically lasting days.  There is no aura.  No associated nausea, vomiting, photophobia, phonophobia, visual disturbance, unilateral numbness or weakness.  Triggers/aggravating factors include standing up, movement, menstrual period, certain sugars.  She has some relief with ibuprofen.  She has seen an allergist for possible triggers.  Environmental allergy testing demonstrated mild allergy to mold intradermally and slightly positive to casein, shellfish and tree nuts, but  not significant enough to suspect the cause of her headaches.  Takes ibuprofen.  Frequency varies, depending on severity. Current NSAIDS:  ibuprofen Current analgesics:  none Current triptans:  none Current ergotamine:  none Current anti-emetic:  none Current muscle relaxants:  none Current anti-anxiolytic:  none Current sleep aide:  none Current Antihypertensive medications:  none Current Antidepressant medications: Celexa 20mg  Current Anticonvulsant medications:  none Current anti-CGRP:  none Current Vitamins/Herbal/Supplements:  none Current Antihistamines/Decongestants:  Nasal spray Other therapy:  none Hormone/birth control:  none Other medications:  Synthroid  Past NSAIDS:  Indomethacin, Aleve Past analgesics:  Tylenol, Excedrin Migraine, morphine Past abortive triptans:  none Past abortive ergotamine:  none Past muscle relaxants:  none Past anti-emetic:  none Past antihypertensive medications:  none Past antidepressant medications:  Nortriptyline  Past anticonvulsant medications:  topiramate (confusion) Past anti-CGRP:  none Past vitamins/Herbal/Supplements:  none Past antihistamines/decongestants:  none Other past therapies:  none  Caffeine:  No caffeine Depression:  no; Anxiety:  no Other pain:  Left hip/leg pain Sleep hygiene:  good Family history of headache:  Brother has headaches due to CSF leaks.  CBC and CMP from June unremarkable  Past Medical History: Past Medical History:  Diagnosis Date  . Headache(784.0)    24 hours a day      Medications: Outpatient Encounter Medications as of 07/13/2019  Medication Sig  . acetaminophen (TYLENOL) 325 MG tablet Take 2 tablets (650 mg total) by mouth once.  . citalopram (CELEXA) 20 MG tablet TAKE 1 TABLET BY MOUTH ONCE DAILY FOR DEPRESSION  . levothyroxine (SYNTHROID) 75 MCG tablet TAKE 1 TABLET BY  MOUTH IN THE MORNING FOR HYPOTHYROIDISM  . nortriptyline (PAMELOR) 10 MG capsule Take 10 mg by mouth at bedtime.  Take three(3) tablets at bedtime   No facility-administered encounter medications on file as of 07/13/2019.     Allergies: Allergies  Allergen Reactions  . Augmentin [Amoxicillin-Pot Clavulanate] Rash    Family History: Family History  Problem Relation Age of Onset  . Arthritis Father   . Headache Brother   . Lymphoma Other     Social History: Social History   Socioeconomic History  . Marital status: Married    Spouse name: Not on file  . Number of children: 3  . Years of education: 4216  . Highest education level: Not on file  Occupational History  . Occupation: Magazine features editorTeacher    Employer: [employer]  Social Needs  . Financial resource strain: Not on file  . Food insecurity    Worry: Not on file    Inability: Not on file  . Transportation needs    Medical: Not on file    Non-medical: Not on file  Tobacco Use  . Smoking status: Never Smoker  . Smokeless tobacco: Never Used  Substance and Sexual Activity  . Alcohol use: No  . Drug use: No  . Sexual activity: Not on file  Lifestyle  . Physical activity    Days per week: Not on file    Minutes per session: Not on file  . Stress: Not on file  Relationships  . Social Musicianconnections    Talks on phone: Not on file    Gets together: Not on file    Attends religious service: Not on file    Active member of club or organization: Not on file    Attends meetings of clubs or organizations: Not on file    Relationship status: Not on file  . Intimate partner violence    Fear of current or ex partner: Not on file    Emotionally abused: Not on file    Physically abused: Not on file    Forced sexual activity: Not on file  Other Topics Concern  . Not on file  Social History Narrative  . Not on file    Observations/Objective:   Height 5\' 5"  (1.651 m), weight 205 lb (93 kg). No acute distress.  Alert and oriented.  Speech fluent and not dysarthric.  Language intact.  Eyes orthophoric on primary gaze.  Face  symmetric.  Assessment and Plan:   Chronic migraine without aura, with status migrainosus, intractable  1.  Due to the longstanding persistent headaches, will check MRI of brain 2.  Start Ajovy 3.  May take ibuprofen, but Limit use of pain relievers to no more than 2 days out of week to prevent risk of rebound or medication-overuse headache. 4.  Keep headache diary 5.  Follow up in 4 months.  Follow Up Instructions:    -I discussed the assessment and treatment plan with the patient. The patient was provided an opportunity to ask questions and all were answered. The patient agreed with the plan and demonstrated an understanding of the instructions.   The patient was advised to call back or seek an in-person evaluation if the symptoms worsen or if the condition fails to improve as anticipated.   Cira ServantAdam Robert , DO

## 2019-07-13 ENCOUNTER — Telehealth (INDEPENDENT_AMBULATORY_CARE_PROVIDER_SITE_OTHER): Payer: BC Managed Care – PPO | Admitting: Neurology

## 2019-07-13 ENCOUNTER — Other Ambulatory Visit: Payer: Self-pay

## 2019-07-13 ENCOUNTER — Encounter: Payer: Self-pay | Admitting: Neurology

## 2019-07-13 VITALS — Ht 65.0 in | Wt 205.0 lb

## 2019-07-13 DIAGNOSIS — G43711 Chronic migraine without aura, intractable, with status migrainosus: Secondary | ICD-10-CM | POA: Diagnosis not present

## 2019-07-13 DIAGNOSIS — R519 Headache, unspecified: Secondary | ICD-10-CM

## 2019-07-13 MED ORDER — AJOVY 225 MG/1.5ML ~~LOC~~ SOAJ: 225.0000 mg | SUBCUTANEOUS | 11 refills | Status: DC

## 2019-07-13 NOTE — Addendum Note (Signed)
Addended by: Clois Comber on: 07/13/2019 12:35 PM   Modules accepted: Orders

## 2019-08-08 DIAGNOSIS — Z1231 Encounter for screening mammogram for malignant neoplasm of breast: Secondary | ICD-10-CM | POA: Diagnosis not present

## 2019-08-09 ENCOUNTER — Telehealth: Payer: Self-pay | Admitting: Neurology

## 2019-08-09 NOTE — Telephone Encounter (Signed)
Patient left VM about having some medication questions. Thanks!

## 2019-08-10 ENCOUNTER — Telehealth: Payer: Self-pay | Admitting: Neurology

## 2019-08-10 NOTE — Telephone Encounter (Signed)
See 03/10/19 encounter

## 2019-08-10 NOTE — Telephone Encounter (Signed)
Called and spoke with Pt. He insurance information is not correct in our system and I am having trouble with PA Pt gave permission for me to take a picture of co-pay card and text to her. Pt sent a picture back to me of Rx card information and BCBS card.  Arie Sabina Info- [id number] PCN: [id number] GRP: [id number] ID#: [id number]

## 2019-08-10 NOTE — Telephone Encounter (Signed)
Insurance is needing clarification for the syringe for her headaches that she really needs this. [facility] is telling her it would be 700$ for medication. She is needing to figure out what is going on. Thanks!

## 2019-08-17 ENCOUNTER — Telehealth: Payer: Self-pay | Admitting: Neurology

## 2019-08-17 NOTE — Telephone Encounter (Signed)
Patient is trying to get Ajovy injection at pharm and she is thinking they need PA done. Please call patient with update. Thanks!

## 2019-08-18 NOTE — Telephone Encounter (Signed)
Please advise if you have completed, thanks

## 2019-08-22 NOTE — Telephone Encounter (Signed)
[facility] in [place] is needing a PA for the Ajovy medication-

## 2019-08-22 NOTE — Telephone Encounter (Signed)
Patient is needing the provider to call [facility] at [phone]. Thanks!

## 2019-08-23 ENCOUNTER — Encounter: Payer: Self-pay | Admitting: *Deleted

## 2019-08-23 NOTE — Progress Notes (Addendum)
We received a letter from [facility] 08/24/19 stating that they require trying triptans first The letter was given to Dr. Tomi Likens then will be sent to scan Herbie Saxon Key: [id number] - PA Case ID: [id number] - Rx #: [id number] Need help? Call us at [phone] Status Sent to [place] 225MG /1.5ML syringes Form [facility] Electronic PA Form Original Claim Info [address] Key: [id number] - [place] Case ID: [id number] - Rx #: [id number] Need help? Call us at [phone] Outcome Deniedtoday CaseId:[id number];Status:Denied;Review Type:Prior Auth;Appeal Information: Attention:ATTN: [place] [id number]. OXBDZ,HG,[id number] HDQQI:[phone] ERD:[phone]; Important - Please read the below note on eAppeals: Please reference the denial letter for information on the rights for an appeal, rationale for the denial, and how to submit an appeal including if any information is needed to support the appeal. Note about urgent situations - Generally, an urgent situation is one which, in the opinion of the provider, the health of the patient may be in serious jeopardy or may experience pain that cannot be adequately controlled while waiting for a decision on the appeal.; Drug Ajovy 225MG /1.5ML syringes Form [facility] Electronic PA Form Original Claim Info 75

## 2019-08-25 ENCOUNTER — Telehealth: Payer: Self-pay | Admitting: Neurology

## 2019-08-25 NOTE — Telephone Encounter (Signed)
Ajovy not approved by insurance as patient must first try a triptan (a medication to take when she has the headache.  I would like to start her on sumatriptan 100mg : Take 1 tablet at earliest onset of headache.  She may repeat dose once after 2 hours if needed.  Maximum 2 tablets in 24 hours.   Please prescribe whatever is the default amount (usually 9 or 10 tablets).  May include 2 refills.  In the meantime, for abortive therapy, we can start propranolol ER 80mg  daily.  It is typically used for blood pressure but is effective in treating migraines.  She should monitor for lightheadedness.  She should let us know how she feels in about a month and if no improvement, then we can retry Ajovy.

## 2019-08-25 NOTE — Telephone Encounter (Signed)
I informed her of the letter we received and verified she received it as well, for insurance denying Ajovy because they recommend trying a Triptan first. She stated she had in the past, it caused her memory problems. I asked her to get those records to [place] and we will scan them into her chart and we can resubmit the prior authorization. We will need names of meds and quantity taken and for how long they were tried.  Once she gets this information back to [place] we will proceed from there.

## 2019-08-25 NOTE — Telephone Encounter (Signed)
Patient is calling in about the ajovy medication injection and is needing someone to call [facility] so she can pick this up at the [facility]. Thanks!

## 2019-09-01 NOTE — Telephone Encounter (Signed)
Patient called and left a message stating she was returning a call.

## 2019-09-02 ENCOUNTER — Telehealth: Payer: Self-pay | Admitting: Neurology

## 2019-09-02 NOTE — Telephone Encounter (Signed)
Patient called to check on the prior authorization for Ajovy. She'd like to know if her previous records have been received showing the previous medications she's tried.  Patient stated she's been trying to get this done for awhile and would like an update on the status please.  [facility] in [place]

## 2019-09-05 NOTE — Telephone Encounter (Signed)
See other note

## 2019-09-05 NOTE — Telephone Encounter (Signed)
Please see telephone encounter from 08/25/19: Recommend starting sumatriptan 100mg  for abortive therapy and propranolol ER 60mg  daily for preventative therapy.

## 2019-09-05 NOTE — Telephone Encounter (Signed)
Yes tried to call Friday no voicemail was setup. Called today , she said she got a new phone had not set up voicemail.   I told her we received the fax it was not legible so she called her former doc and told me it said she had tried Nortiptyline. I informed her that is not a Triptan and the letter of denial from the insurance company stated Ajovy  was denied because she had not tried a triptan. She asked that we relay this to Dr. Tomi Likens and that she will try whatever he recommends. I told her I will ask him to recommend on how to proceed from here and let her know.

## 2019-09-05 NOTE — Telephone Encounter (Signed)
~  Michelle Allison  Have this been completed

## 2019-09-07 ENCOUNTER — Telehealth: Payer: Self-pay | Admitting: *Deleted

## 2019-09-07 DIAGNOSIS — G43711 Chronic migraine without aura, intractable, with status migrainosus: Secondary | ICD-10-CM

## 2019-09-07 DIAGNOSIS — R519 Headache, unspecified: Secondary | ICD-10-CM

## 2019-09-07 MED ORDER — SUMATRIPTAN SUCCINATE 100 MG PO TABS: 100.0000 mg | ORAL_TABLET | Freq: Once | ORAL | 2 refills | Status: DC | PRN

## 2019-09-07 MED ORDER — PROPRANOLOL HCL ER 80 MG PO CP24: 80.0000 mg | ORAL_CAPSULE | Freq: Every day | ORAL | 1 refills | Status: AC

## 2019-09-07 NOTE — Telephone Encounter (Signed)
-----   Message from Azalee Course sent at 09/05/2019 11:54 AM EDT ----- Regarding: presciption needs called in Will you please see the telephone note from Dr. Tomi Likens, I copied it here below:  and send in the prescriptions?   I talked to her and she wants to try these and get the prescriptions today. Her pharrnacy remains the same as listed on her Snapshot tab on her chart.   Ajovy not approved by insurance as patient must first try a triptan (a medication to take when she has the headache.  I would like to start her on sumatriptan 100mg : Take 1 tablet at earliest onset of headache.  She may repeat dose once after 2 hours if needed.  Maximum 2 tablets in 24 hours.   Please prescribe whatever is the default amount (usually 9 or 10 tablets).  May include 2 refills.  In the meantime, for abortive therapy, we can start propranolol ER 80mg  daily.  It is typically used for blood pressure but is effective in treating migraines.  She should monitor for lightheadedness.  She should let us know how she feels in about a month and if no improvement, then we can retry Ajovy.  Thanks

## 2019-09-07 NOTE — Telephone Encounter (Signed)
Called Michelle Allison and let her know below prescriptions were sent in to her [facility]. Sumatriptin 100mg  prn migraine and Propranolol ER 80mg  daily. Informed patient Propranolol may cause lightheadedness and to let office knows if she experiences any. Also she will call in about a month and tell us if it is working or if she wants to try to reapply for Ajovy. Patient verbalized understanding of all above.

## 2019-10-05 ENCOUNTER — Telehealth: Payer: Self-pay | Admitting: Neurology

## 2019-10-05 DIAGNOSIS — G43711 Chronic migraine without aura, intractable, with status migrainosus: Secondary | ICD-10-CM

## 2019-10-05 NOTE — Telephone Encounter (Signed)
Patient left VM stating that the medication he put her on is not working and would ike to know what the next step is please call

## 2019-10-05 NOTE — Telephone Encounter (Signed)
Medications are not working, wants a different Rx. Patient does not feel a headache coming on, really wants a MRI and try injections.

## 2019-10-06 ENCOUNTER — Other Ambulatory Visit: Payer: Self-pay

## 2019-10-06 ENCOUNTER — Other Ambulatory Visit: Payer: Self-pay | Admitting: Neurology

## 2019-10-06 ENCOUNTER — Encounter: Payer: Self-pay | Admitting: *Deleted

## 2019-10-06 MED ORDER — AJOVY 225 MG/1.5ML ~~LOC~~ SOAJ: 225.0000 mg | SUBCUTANEOUS | 11 refills | Status: AC

## 2019-10-06 NOTE — Telephone Encounter (Signed)
Then have her stop propranolol and I want to start her on Ajovy 1 injection every 30 days.  She has tried sumatriptan, propranolol, topiramate and nortriptyline.  I resent the prescription for Ajovy to the her pharmacy.  Will cc: Laureen Ochs to expect the appeal

## 2019-10-06 NOTE — Telephone Encounter (Signed)
Also, I did want to order an MRI of brain with and without contrast for worsening headaches.  And I would like her to make a follow up appointment in 4 months (virtual or in-office)

## 2019-10-06 NOTE — Telephone Encounter (Signed)
Patient was back and cspine and brain MRI, due to all the headaches, Etc. Please readvise on friday

## 2019-10-06 NOTE — Telephone Encounter (Signed)
So order only MRI?

## 2019-10-06 NOTE — Telephone Encounter (Signed)
MRI of brain is for chronic migraine without aura, without status migrainosus, intractable

## 2019-10-06 NOTE — Telephone Encounter (Signed)
Just MRI of brain

## 2019-10-06 NOTE — Progress Notes (Signed)
Herbie Saxon Key: [id number] - PA Case ID: [id number] Need help? Call us at [phone] Outcome Approvedtoday CaseId:[id number];Status:Approved;Review Type:Prior Auth;Coverage Start Date:09/06/2019;Coverage End Date:10/05/2020; Drug Ajovy 225MG /1.5ML [place] SOSY Form [facility] Electronic PA Form

## 2019-10-06 NOTE — Telephone Encounter (Signed)
Line busy at [facility]

## 2019-10-07 DIAGNOSIS — S76012A Strain of muscle, fascia and tendon of left hip, initial encounter: Secondary | ICD-10-CM | POA: Diagnosis not present

## 2019-10-07 DIAGNOSIS — Z23 Encounter for immunization: Secondary | ICD-10-CM | POA: Diagnosis not present

## 2019-10-07 DIAGNOSIS — Z6834 Body mass index (BMI) 34.0-34.9, adult: Secondary | ICD-10-CM | POA: Diagnosis not present

## 2019-10-07 NOTE — Progress Notes (Signed)
Script for [employer] faxed to [facility] in [place]. Fax [phone]. PA approved yesterday.

## 2019-10-12 NOTE — Telephone Encounter (Signed)
[employer] please see order

## 2019-10-12 NOTE — Telephone Encounter (Signed)
Order placed for MRI W& W/O  At [facility]  Patient informed of this

## 2019-10-17 ENCOUNTER — Other Ambulatory Visit: Payer: Self-pay | Admitting: Neurology

## 2019-10-19 ENCOUNTER — Ambulatory Visit
Admission: RE | Admit: 2019-10-19 | Discharge: 2019-10-19 | Disposition: A | Discharge status: Home / Self Care | Payer: BC Managed Care – PPO | Source: Ambulatory Visit | Attending: Neurology | Admitting: Neurology

## 2019-10-19 ENCOUNTER — Other Ambulatory Visit: Payer: Self-pay

## 2019-10-19 DIAGNOSIS — E348 Other specified endocrine disorders: Secondary | ICD-10-CM | POA: Diagnosis not present

## 2019-10-19 DIAGNOSIS — G43711 Chronic migraine without aura, intractable, with status migrainosus: Secondary | ICD-10-CM

## 2019-10-19 MED ORDER — GADOBENATE DIMEGLUMINE 529 MG/ML IV SOLN
20.0000 mL | Freq: Once | INTRAVENOUS | Status: AC | PRN
  Administered 2019-10-19: 20 mL via INTRAVENOUS

## 2019-10-20 ENCOUNTER — Telehealth: Payer: Self-pay

## 2019-10-20 ENCOUNTER — Telehealth: Payer: Self-pay | Admitting: Neurology

## 2019-10-20 NOTE — Telephone Encounter (Addendum)
Called spoke with she was informed of results. She started Ajovy yesterday. She was sent to front desk to schedule follow up within 3-4 months.  Patient is requesting more scan to cover other portions to see where here headaches are coming from. She states that her insurance will be change in Jan were and she wants more done before her insurance changes.

## 2019-10-20 NOTE — Telephone Encounter (Signed)
What scan does she want performed?

## 2019-10-20 NOTE — Telephone Encounter (Signed)
She is not sure whatever you recommend. She states maybe further down her head. She is not sure why she is still having headache when her MRI of her head is unremarkable. She thinks that there is a spot somewhere.

## 2019-10-20 NOTE — Telephone Encounter (Signed)
Patient is calling in about wanting to do a lower MRI- she said she has met her deductible and wanting to see if maybe something else is causing the headaches. She was wondering if this was possible to check her spinal cord. She knows something is causing the headaches and trying to get ahead of the game while she has reached deductible. Thanks

## 2019-10-20 NOTE — Telephone Encounter (Signed)
-----   Message from Pieter Partridge, DO sent at 10/20/2019  7:05 AM EDT ----- MRI of brain is unremarkable.  There is a small cyst, but it is benign and not concerning.  It is an incidental finding and not the cause of headaches.  If she just took her first dose of Ajovy, I would like her to make a follow up in 3 to 4 months.  I usually like to give the medication that much time to assess if really effective or not.

## 2019-10-20 NOTE — Telephone Encounter (Signed)
duplicate see other telephone note. Will copy this to that note

## 2019-10-20 NOTE — Telephone Encounter (Signed)
Patient is calling in about wanting to do a lower MRI- she said she has met her deductible and wanting to see if maybe something else is causing the headaches. She was wondering if this was possible to check her spinal cord. She knows something is causing the headaches and trying to get ahead of the game while she has reached deductible. Thanks!

## 2019-10-21 NOTE — Telephone Encounter (Signed)
That likely is not indicated but will wait on Dr. Georgie Chard response.  Most migraines are idiopathic (cause unknown).

## 2019-10-21 NOTE — Telephone Encounter (Signed)
Called patient to make her aware of this no answer. Voice mail box not set up for messages

## 2019-10-24 NOTE — Telephone Encounter (Signed)
Spoke with patient she states that she has been dealing with this since she was a little kids. She states that her brother had a spinal leak that was very hard to find. He also has similar headaches. She is requesting additional spine imaging similar to what her brother had. She states that he had a procedure done where there was dye place in his spine.   Pt aware Dr. Tomi Likens is out of office until Thursday and this will wait until he returns.

## 2019-10-26 NOTE — Telephone Encounter (Signed)
She is probably talking about a CT myelogram.  It tends to be an uncomfortable procedure, so I favor trying to treat the headaches first and if no improvement by follow up visit, then we can reconsider further imaging.  If she is adamant, then we can order it to evaluate for spinal leak.

## 2019-10-27 ENCOUNTER — Telehealth: Payer: Self-pay | Admitting: Nurse Practitioner

## 2019-10-27 ENCOUNTER — Telehealth: Payer: Self-pay | Admitting: Neurology

## 2019-10-27 DIAGNOSIS — G43711 Chronic migraine without aura, intractable, with status migrainosus: Secondary | ICD-10-CM

## 2019-10-27 DIAGNOSIS — R519 Headache, unspecified: Secondary | ICD-10-CM

## 2019-10-27 DIAGNOSIS — G9600 Cerebrospinal fluid leak, unspecified: Secondary | ICD-10-CM

## 2019-10-27 NOTE — Telephone Encounter (Signed)
Called patient she was informed of provider response she will discuss with her brother and call back if she decides to go through with the CT myelogram

## 2019-10-27 NOTE — Telephone Encounter (Signed)
Spoke with Angelita Ingles given correct codes to place orders She will call patient to set up

## 2019-10-27 NOTE — Telephone Encounter (Signed)
Patient is retuning a call to the office. Thank you

## 2019-10-27 NOTE — Telephone Encounter (Signed)
Spoke with patient she states that her brother had a rare spontaneously spinal fluid leak.  She would like to go forth with the CT Myelogram Order place she has contact to call them if they have not called her within the next 3 days

## 2019-10-27 NOTE — Telephone Encounter (Signed)
Phone call to patient to verify medication list and allergies for myelogram procedure. Pt instructed to hold Celexa for 48hrs prior to myelogram appointment time. Pt verbalized understanding. Pre and post procedure instructions reviewed with pt. 

## 2019-10-28 DIAGNOSIS — M25559 Pain in unspecified hip: Secondary | ICD-10-CM | POA: Diagnosis not present

## 2019-11-04 DIAGNOSIS — M25452 Effusion, left hip: Secondary | ICD-10-CM | POA: Diagnosis not present

## 2019-11-04 DIAGNOSIS — M25552 Pain in left hip: Secondary | ICD-10-CM | POA: Diagnosis not present

## 2019-11-04 DIAGNOSIS — M1612 Unilateral primary osteoarthritis, left hip: Secondary | ICD-10-CM | POA: Diagnosis not present

## 2019-11-07 ENCOUNTER — Other Ambulatory Visit: Payer: Self-pay

## 2019-11-07 ENCOUNTER — Ambulatory Visit
Admission: RE | Admit: 2019-11-07 | Discharge: 2019-11-07 | Disposition: A | Discharge status: Home / Self Care | Payer: BC Managed Care – PPO | Source: Ambulatory Visit | Attending: Neurology | Admitting: Neurology

## 2019-11-07 DIAGNOSIS — M5126 Other intervertebral disc displacement, lumbar region: Secondary | ICD-10-CM | POA: Diagnosis not present

## 2019-11-07 DIAGNOSIS — G43711 Chronic migraine without aura, intractable, with status migrainosus: Secondary | ICD-10-CM

## 2019-11-07 DIAGNOSIS — R519 Headache, unspecified: Secondary | ICD-10-CM

## 2019-11-07 DIAGNOSIS — G9600 Cerebrospinal fluid leak, unspecified: Secondary | ICD-10-CM

## 2019-11-07 DIAGNOSIS — M48061 Spinal stenosis, lumbar region without neurogenic claudication: Secondary | ICD-10-CM | POA: Diagnosis not present

## 2019-11-07 DIAGNOSIS — M4802 Spinal stenosis, cervical region: Secondary | ICD-10-CM | POA: Diagnosis not present

## 2019-11-07 DIAGNOSIS — M50223 Other cervical disc displacement at C6-C7 level: Secondary | ICD-10-CM | POA: Diagnosis not present

## 2019-11-07 MED ORDER — DIAZEPAM 5 MG PO TABS: 10.0000 mg | ORAL_TABLET | Freq: Once | ORAL | Status: DC

## 2019-11-07 MED ORDER — IOPAMIDOL (ISOVUE-M 300) INJECTION 61%
10.0000 mL | Freq: Once | INTRAMUSCULAR | Status: AC
  Administered 2019-11-07: 10 mL via INTRATHECAL

## 2019-11-07 NOTE — Discharge Instructions (Signed)

## 2019-11-07 NOTE — Progress Notes (Signed)
Patient states she has been off Celexa for at least the past two days. 

## 2019-11-08 ENCOUNTER — Telehealth: Payer: Self-pay

## 2019-11-08 NOTE — Telephone Encounter (Signed)
-----   Message from Pieter Partridge, DO sent at 11/08/2019  7:45 AM EST ----- Myelogram shows no areas of spontaneous CSF leaks

## 2019-11-08 NOTE — Telephone Encounter (Signed)
Patient aware of results.  She requests an appointment.  Appointment scheduled.

## 2019-11-13 NOTE — Progress Notes (Signed)
Virtual Visit via Video Note The purpose of this virtual visit is to provide medical care while limiting exposure to the novel coronavirus.    Consent was obtained for video visit:  Yes Answered questions that patient had about telehealth interaction:  Yes I discussed the limitations, risks, security and privacy concerns of performing an evaluation and management service by telemedicine. I also discussed with the patient that there may be a patient responsible charge related to this service. The patient expressed understanding and agreed to proceed.  Pt location: Home Physician Location: office Name of referring provider:  Richardean Chimeraaniel, Terry G, MD I connected with Michelle LulasWendy L Bielefeld at patients initiation/request on 11/14/2019 at  9:10 AM EST by video enabled telemedicine application and verified that I am speaking with the correct person using two identifiers. Pt MRN:  [id number] Pt DOB:  03-29-1961 Video Participants:  Michelle LulasWendy L Alto   History of Present Illness:  Michelle Allison is a 58 year old woman who follows up for chronic migraines.  UPDATE: MRI of brain with and without contrast from 10/19/2019 showed incidental 6 mm pineal cyst but otherwise unremarkable. CT myelogram from 11/07/2019 was negative for CSF leak.  She does have degenerative changes   Ajovy was initially denied due to having not tried propranolol.  She started propranolol but was ineffective.  She was then started on Ajovy last month, October 29.  No improvement yet.  She has a persistent headache.  Headache usually 8/10 but may be lesser. Takes ibuprofen about 1 or 2 a week.  Frequency varies, depending on severity. Current NSAIDS:  ibuprofen Current analgesics:  none Current triptans:  sumatriptan 100mg  Current ergotamine:  none Current anti-emetic:  none Current muscle relaxants:  none Current anti-anxiolytic:  none Current sleep aide:  none Current Antihypertensive medications:  none Current Antidepressant  medications: Celexa 20mg  Current Anticonvulsant medications:  none Current anti-CGRP:  Ajovy Current Vitamins/Herbal/Supplements:  none Current Antihistamines/Decongestants:  Nasal spray Other therapy:  none Hormone/birth control:  none Other medications:  Synthroid  Caffeine:  No caffeine Depression:  no; Anxiety:  no Other pain:  Left hip/leg pain Sleep hygiene:  good  HISTORY: She has had daily persistent headaches since she was a teenager, during puberty.  She had CT head at the time which was unremarkable.  They are in the back of her head but also side and front.  It can fluctuate from 4 to 10.  It can be a squeezing pain.  Not a thunderclap headache or worst headache of her life.  Severe fluctuations vary in frequency, typically lasting days.  There is no aura.  No associated nausea, vomiting, photophobia, phonophobia, visual disturbance, unilateral numbness or weakness.  Triggers/aggravating factors include standing up, movement, menstrual period, certain sugars.  She has some relief with ibuprofen.  She has seen an allergist for possible triggers.  Environmental allergy testing demonstrated mild allergy to mold intradermally and slightly positive to casein, shellfish and tree nuts, but not significant enough to suspect the cause of her headaches.   Past NSAIDS:  Indomethacin, Aleve Past analgesics:  Tylenol, Excedrin Migraine, morphine Past abortive triptans:  sumatriptan 100mg  (ineffective) Past abortive ergotamine:  none Past muscle relaxants:  none Past anti-emetic:  none Past antihypertensive medications:  propranolol ER 80mg  Past antidepressant medications:  Nortriptyline  Past anticonvulsant medications:  topiramate (confusion) Past anti-CGRP:  none Past vitamins/Herbal/Supplements:  none Past antihistamines/decongestants:  none Other past therapies:  none   Family history of headache:  Brother has headaches  due to CSF leaks.  Past Medical History: Past Medical  History:  Diagnosis Date  . Headache(784.0)    24 hours a day      Medications: Outpatient Encounter Medications as of 11/14/2019  Medication Sig  . citalopram (CELEXA) 20 MG tablet TAKE 1 TABLET BY MOUTH ONCE DAILY FOR DEPRESSION  . Fremanezumab-vfrm (AJOVY) 225 MG/1.5ML SOAJ Inject 225 mg into the skin every 30 (thirty) days.  Marland Kitchen levothyroxine (SYNTHROID) 75 MCG tablet TAKE 1 TABLET BY MOUTH IN THE MORNING FOR HYPOTHYROIDISM  . propranolol ER (INDERAL LA) 80 MG 24 hr capsule Take 1 capsule (80 mg total) by mouth daily. (Patient not taking: Reported on 10/27/2019)   No facility-administered encounter medications on file as of 11/14/2019.     Allergies: Allergies  Allergen Reactions  . Augmentin [Amoxicillin-Pot Clavulanate] Rash    Family History: Family History  Problem Relation Age of Onset  . Arthritis Father   . Headache Brother   . Lymphoma Other     Social History: Social History   Socioeconomic History  . Marital status: Married    Spouse name: Herbie Baltimore  . Number of children: 3  . Years of education: 16  . Highest education level: Bachelor's degree (e.g., BA, AB, BS)  Occupational History  . Not on file  Social Needs  . Financial resource strain: Not on file  . Food insecurity    Worry: Not on file    Inability: Not on file  . Transportation needs    Medical: Not on file    Non-medical: Not on file  Tobacco Use  . Smoking status: Never Smoker  . Smokeless tobacco: Never Used  Substance and Sexual Activity  . Alcohol use: No  . Drug use: No  . Sexual activity: Not on file  Lifestyle  . Physical activity    Days per week: Not on file    Minutes per session: Not on file  . Stress: Not on file  Relationships  . Social Herbalist on phone: Not on file    Gets together: Not on file    Attends religious service: Not on file    Active member of club or organization: Not on file    Attends meetings of clubs or organizations: Not on file     Relationship status: Not on file  . Intimate partner violence    Fear of current or ex partner: Not on file    Emotionally abused: Not on file    Physically abused: Not on file    Forced sexual activity: Not on file  Other Topics Concern  . Not on file  Social History Narrative   Patient is right-handed. She lives with her husband and her 3 children in a 2 level home. 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 She drinks hot tea and ice tea daily. She works in her garden and rides horses.    Observations/Objective:   Height 5\' 5"  (1.651 m), weight 210 lb (95.3 kg). No acute distress.  Alert and oriented.  Speech fluent and not dysarthric.  Language intact.  Eyes orthophoric on primary gaze.  Face symmetric.  Assessment and Plan:   Longstanding history of chronic daily headaches since childhood.  Possibly chronic migraine without aura, without status migrainosus, intractable.  MRI of brain shows no intracranial etiology.  Myelogram showed no evidence of spontaneous CSF leak.  She does have some degenerative changes in cervical spine which may be contributing.  1.  For preventative management, will  continue Ajovy for now. 2.  For abortive therapy, she will retry sumatriptan.   3.  Limit use of pain relievers to no more than 2 days out of week to prevent risk of rebound or medication-overuse headache. 4.  Keep headache diary 5.  Exercise, hydration, caffeine cessation, sleep hygiene, monitor for and avoid triggers 6.  Consider:  magnesium citrate 400mg  daily, riboflavin 400mg  daily, and coenzyme Q10 100mg  three times daily 7. Follow up 3 to 4 months.   Follow Up Instructions:    -I discussed the assessment and treatment plan with the patient. The patient was provided an opportunity to ask questions and all were answered. The patient agreed with the plan and demonstrated an understanding of the instructions.   The patient was advised to call back or seek an in-person evaluation if the symptoms worsen  or if the condition fails to improve as anticipated.   , DO

## 2019-11-14 ENCOUNTER — Encounter: Payer: Self-pay | Admitting: Neurology

## 2019-11-14 ENCOUNTER — Other Ambulatory Visit: Payer: Self-pay

## 2019-11-14 ENCOUNTER — Telehealth (INDEPENDENT_AMBULATORY_CARE_PROVIDER_SITE_OTHER): Payer: BC Managed Care – PPO | Admitting: Neurology

## 2019-11-14 VITALS — Ht 65.0 in | Wt 210.0 lb

## 2019-11-14 DIAGNOSIS — G43711 Chronic migraine without aura, intractable, with status migrainosus: Secondary | ICD-10-CM

## 2019-11-29 DIAGNOSIS — M25552 Pain in left hip: Secondary | ICD-10-CM | POA: Diagnosis not present

## 2019-11-29 DIAGNOSIS — Z79899 Other long term (current) drug therapy: Secondary | ICD-10-CM | POA: Diagnosis not present

## 2019-11-29 DIAGNOSIS — Z880 Allergy status to penicillin: Secondary | ICD-10-CM | POA: Diagnosis not present

## 2019-11-29 DIAGNOSIS — M1612 Unilateral primary osteoarthritis, left hip: Secondary | ICD-10-CM | POA: Diagnosis not present

## 2019-11-29 DIAGNOSIS — Z888 Allergy status to other drugs, medicaments and biological substances status: Secondary | ICD-10-CM | POA: Diagnosis not present

## 2019-11-29 DIAGNOSIS — M5442 Lumbago with sciatica, left side: Secondary | ICD-10-CM | POA: Diagnosis not present

## 2019-11-30 ENCOUNTER — Telehealth: Payer: Self-pay | Admitting: Neurology

## 2019-11-30 DIAGNOSIS — N939 Abnormal uterine and vaginal bleeding, unspecified: Secondary | ICD-10-CM | POA: Diagnosis not present

## 2019-11-30 NOTE — Telephone Encounter (Signed)
See below Patient only been seen for migraines  This would be a new problems  New patient appt?

## 2019-11-30 NOTE — Telephone Encounter (Signed)
See below Patient needs f/u VV or in person

## 2019-11-30 NOTE — Telephone Encounter (Signed)
Follow up slot is fine

## 2019-11-30 NOTE — Telephone Encounter (Signed)
Patient called in regarding her going to [facility] yesterday due to her Left Hip hurting so bad. She said they told her that she had L4  L 5Spinal Stenosis. She was given Pain Medications and prednisone as well as a Shot. She was told to follow up with her Neurologist

## 2019-12-05 DIAGNOSIS — S233XXA Sprain of ligaments of thoracic spine, initial encounter: Secondary | ICD-10-CM | POA: Diagnosis not present

## 2019-12-05 DIAGNOSIS — S134XXA Sprain of ligaments of cervical spine, initial encounter: Secondary | ICD-10-CM | POA: Diagnosis not present

## 2019-12-05 DIAGNOSIS — S335XXA Sprain of ligaments of lumbar spine, initial encounter: Secondary | ICD-10-CM | POA: Diagnosis not present

## 2019-12-07 DIAGNOSIS — S233XXA Sprain of ligaments of thoracic spine, initial encounter: Secondary | ICD-10-CM | POA: Diagnosis not present

## 2019-12-07 DIAGNOSIS — S335XXA Sprain of ligaments of lumbar spine, initial encounter: Secondary | ICD-10-CM | POA: Diagnosis not present

## 2019-12-07 DIAGNOSIS — S134XXA Sprain of ligaments of cervical spine, initial encounter: Secondary | ICD-10-CM | POA: Diagnosis not present

## 2019-12-09 DIAGNOSIS — S233XXA Sprain of ligaments of thoracic spine, initial encounter: Secondary | ICD-10-CM | POA: Diagnosis not present

## 2019-12-09 DIAGNOSIS — S335XXA Sprain of ligaments of lumbar spine, initial encounter: Secondary | ICD-10-CM | POA: Diagnosis not present

## 2019-12-09 DIAGNOSIS — S134XXA Sprain of ligaments of cervical spine, initial encounter: Secondary | ICD-10-CM | POA: Diagnosis not present

## 2019-12-12 DIAGNOSIS — S233XXA Sprain of ligaments of thoracic spine, initial encounter: Secondary | ICD-10-CM | POA: Diagnosis not present

## 2019-12-12 DIAGNOSIS — S335XXA Sprain of ligaments of lumbar spine, initial encounter: Secondary | ICD-10-CM | POA: Diagnosis not present

## 2019-12-12 DIAGNOSIS — S134XXA Sprain of ligaments of cervical spine, initial encounter: Secondary | ICD-10-CM | POA: Diagnosis not present

## 2019-12-19 DIAGNOSIS — S335XXA Sprain of ligaments of lumbar spine, initial encounter: Secondary | ICD-10-CM | POA: Diagnosis not present

## 2019-12-19 DIAGNOSIS — S134XXA Sprain of ligaments of cervical spine, initial encounter: Secondary | ICD-10-CM | POA: Diagnosis not present

## 2019-12-19 DIAGNOSIS — S233XXA Sprain of ligaments of thoracic spine, initial encounter: Secondary | ICD-10-CM | POA: Diagnosis not present

## 2019-12-21 DIAGNOSIS — S134XXA Sprain of ligaments of cervical spine, initial encounter: Secondary | ICD-10-CM | POA: Diagnosis not present

## 2019-12-21 DIAGNOSIS — S233XXA Sprain of ligaments of thoracic spine, initial encounter: Secondary | ICD-10-CM | POA: Diagnosis not present

## 2019-12-21 DIAGNOSIS — S335XXA Sprain of ligaments of lumbar spine, initial encounter: Secondary | ICD-10-CM | POA: Diagnosis not present

## 2019-12-26 DIAGNOSIS — S335XXA Sprain of ligaments of lumbar spine, initial encounter: Secondary | ICD-10-CM | POA: Diagnosis not present

## 2019-12-26 DIAGNOSIS — S134XXA Sprain of ligaments of cervical spine, initial encounter: Secondary | ICD-10-CM | POA: Diagnosis not present

## 2019-12-26 DIAGNOSIS — S233XXA Sprain of ligaments of thoracic spine, initial encounter: Secondary | ICD-10-CM | POA: Diagnosis not present

## 2019-12-29 DIAGNOSIS — S335XXA Sprain of ligaments of lumbar spine, initial encounter: Secondary | ICD-10-CM | POA: Diagnosis not present

## 2019-12-29 DIAGNOSIS — S134XXA Sprain of ligaments of cervical spine, initial encounter: Secondary | ICD-10-CM | POA: Diagnosis not present

## 2019-12-29 DIAGNOSIS — S233XXA Sprain of ligaments of thoracic spine, initial encounter: Secondary | ICD-10-CM | POA: Diagnosis not present

## 2020-01-02 DIAGNOSIS — S335XXA Sprain of ligaments of lumbar spine, initial encounter: Secondary | ICD-10-CM | POA: Diagnosis not present

## 2020-01-02 DIAGNOSIS — S134XXA Sprain of ligaments of cervical spine, initial encounter: Secondary | ICD-10-CM | POA: Diagnosis not present

## 2020-01-02 DIAGNOSIS — S233XXA Sprain of ligaments of thoracic spine, initial encounter: Secondary | ICD-10-CM | POA: Diagnosis not present

## 2020-01-12 NOTE — Progress Notes (Signed)
Virtual Visit via Video Note The purpose of this virtual visit is to provide medical care while limiting exposure to the novel coronavirus.    Consent was obtained for video visit:  Yes.   Answered questions that patient had about telehealth interaction:  Yes.   I discussed the limitations, risks, security and privacy concerns of performing an evaluation and management service by telemedicine. I also discussed with the patient that there may be a patient responsible charge related to this service. The patient expressed understanding and agreed to proceed.  Pt location: Home Physician Location: office Name of referring provider:  Richardean Chimera, MD I connected with Michelle Allison at patients initiation/request on 01/16/2020 at  8:30 AM EST by video enabled telemedicine application and verified that I am speaking with the correct person using two identifiers. Pt MRN:  [id number] Pt DOB:  1961/08/16 Video Participants:  Michelle Allison;    History of Present Illness:  Michelle Allison is a 59 year old woman who follows up for chronic migraines.  UPDATE: Still a persistent 8-9/10 headache.  It becomes more severe almost daily  If takes ibuprofen or sumatriptan, severe headache lasts 20 minutes before it becomes baseline.   Frequency of abortive medication:  No more than 2 times a week. Current NSAIDS:ibuprofen Current analgesics:none Current triptans:sumatriptan 100mg  (helped a little bit) Current ergotamine:none Current anti-emetic:none Current muscle relaxants:none Current anti-anxiolytic:none Current sleep aide:none Current Antihypertensive medications:none Current Antidepressant medications:Celexa 20mg  Current Anticonvulsant medications:none Current anti-CGRP:Ajovy (s/p 3 injections) Current Vitamins/Herbal/Supplements:none Current Antihistamines/Decongestants:Nasal spray Other therapy:none Hormone/birth control:none Other  medications:Synthroid  Caffeine:No caffeine Depression:no; Anxiety:no Other pain:Left hip/leg pain Sleep hygiene:good  HISTORY: She has had dailypersistentheadaches since she was a teenager, during puberty. She had CT head at the time which was unremarkable. They are in the back of her head but also side and front.It can fluctuate from 4 to 10. It can be a squeezing pain. Not a thunderclap headache or worst headache of her life.Severe fluctuations vary in frequency, typically lasting days.There is no aura.No associated nausea, vomiting, photophobia, phonophobia, visual disturbance, unilateral numbness or weakness.Triggers/aggravating factors includestanding up, movement, menstrual period, certain sugars. She has some relief with ibuprofen. She has seen an allergist for possible triggers. Environmental allergy testing demonstrated mild allergy to mold intradermally and slightly positive to casein, shellfish and tree nuts, but not significant enough to suspect the cause of her headaches.  MRI of brain with and without contrast from 10/19/2019 showed incidental 6 mm pineal cyst but otherwise unremarkable. CT myelogram from 11/07/2019 was negative for CSF leak.  She does have degenerative changes    Past NSAIDS:Indomethacin, Aleve Past analgesics:Tylenol, Excedrin Migraine, morphine Past abortive triptans:sumatriptan 100mg  (ineffective) Past abortive ergotamine:none Past muscle relaxants:none Past anti-emetic:none Past antihypertensive medications:propranolol ER 80mg  Past antidepressant medications:Nortriptyline Past anticonvulsant medications:topiramate (confusion) Past anti-CGRP:none Past vitamins/Herbal/Supplements:none Past antihistamines/decongestants:none Other past therapies:none   Family history of headache:Brother has headaches due to CSF leaks.  Past Medical History: Past Medical History:  Diagnosis Date  .  Headache(784.0)    24 hours a day      Medications: Outpatient Encounter Medications as of 01/16/2020  Medication Sig  . citalopram (CELEXA) 20 MG tablet TAKE 1 TABLET BY MOUTH ONCE DAILY FOR DEPRESSION  . Fremanezumab-vfrm (AJOVY) 225 MG/1.5ML SOAJ Inject 225 mg into the skin every 30 (thirty) days.  11/09/2019 levothyroxine (SYNTHROID) 75 MCG tablet TAKE 1 TABLET BY MOUTH IN THE MORNING FOR HYPOTHYROIDISM  . propranolol ER (INDERAL LA) 80 MG 24  hr capsule Take 1 capsule (80 mg total) by mouth daily. (Patient not taking: Reported on 11/14/2019)   No facility-administered encounter medications on file as of 01/16/2020.    Allergies: Allergies  Allergen Reactions  . Augmentin [Amoxicillin-Pot Clavulanate] Rash    Family History: Family History  Problem Relation Age of Onset  . Arthritis Father   . Headache Brother   . Lymphoma Other     Social History: Social History   Socioeconomic History  . Marital status: Married    Spouse name: Herbie Baltimore  . Number of children: 3  . Years of education: 16  . Highest education level: Bachelor's degree (e.g., BA, AB, BS)  Occupational History  . Not on file  Tobacco Use  . Smoking status: Never Smoker  . Smokeless tobacco: Never Used  Substance and Sexual Activity  . Alcohol use: No  . Drug use: No  . Sexual activity: Not on file  Other Topics Concern  . Not on file  Social History Narrative   Patient is right-handed. She lives with her husband and her 3 children in a 2 level home. 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 She drinks hot tea and ice tea daily. She works in her garden and rides horses.   Social Determinants of Health   Financial Resource Strain:   . Difficulty of Paying Living Expenses: Not on file  Food Insecurity:   . Worried About Charity fundraiser in the Last Year: Not on file  . Ran Out of Food in the Last Year: Not on file  Transportation Needs:   . Lack of Transportation (Medical): Not on file  . Lack of Transportation  (Non-Medical): Not on file  Physical Activity:   . Days of Exercise per Week: Not on file  . Minutes of Exercise per Session: Not on file  Stress:   . Feeling of Stress : Not on file  Social Connections:   . Frequency of Communication with Friends and Family: Not on file  . Frequency of Social Gatherings with Friends and Family: Not on file  . Attends Religious Services: Not on file  . Active Member of Clubs or Organizations: Not on file  . Attends Archivist Meetings: Not on file  . Marital Status: Not on file  Intimate Partner Violence:   . Fear of Current or Ex-Partner: Not on file  . Emotionally Abused: Not on file  . Physically Abused: Not on file  . Sexually Abused: Not on file    Observations/Objective:   Height 5\' 5"  (1.651 m), weight 215 lb (97.5 kg). No acute distress.  Alert and oriented.  Speech fluent and not dysarthric.  Language intact.  Eyes orthophoric on primary gaze.  Face symmetric.  Assessment and Plan:   Chronic migraine without aura, without status migrainosus, intractable  1.  For preventative management, change from Ajovy to Aimovig 140mg  2.  For abortive therapy, ibuprofen (works a little better than  3.  Limit use of pain relievers to no more than 2 days out of week to prevent risk of rebound or medication-overuse headache. 4.  Keep headache diary 5.  Exercise, hydration, caffeine cessation, sleep hygiene, monitor for and avoid triggers 6. Follow up 4 months   Follow Up Instructions:    -I discussed the assessment and treatment plan with the patient. The patient was provided an opportunity to ask questions and all were answered. The patient agreed with the plan and demonstrated an understanding of the instructions.   The  patient was advised to call back or seek an in-person evaluation if the symptoms worsen or if the condition fails to improve as anticipated.   Cira Servant, DO

## 2020-01-13 ENCOUNTER — Encounter: Payer: Self-pay | Admitting: Neurology

## 2020-01-16 ENCOUNTER — Other Ambulatory Visit: Payer: Self-pay

## 2020-01-16 ENCOUNTER — Encounter: Payer: Self-pay | Admitting: *Deleted

## 2020-01-16 ENCOUNTER — Telehealth (INDEPENDENT_AMBULATORY_CARE_PROVIDER_SITE_OTHER): Payer: BC Managed Care – PPO | Admitting: Neurology

## 2020-01-16 VITALS — Ht 65.0 in | Wt 215.0 lb

## 2020-01-16 DIAGNOSIS — G43711 Chronic migraine without aura, intractable, with status migrainosus: Secondary | ICD-10-CM

## 2020-01-16 MED ORDER — AIMOVIG 140 MG/ML ~~LOC~~ SOAJ: 140.0000 mg | SUBCUTANEOUS | 11 refills | Status: AC

## 2020-01-16 NOTE — Progress Notes (Addendum)
Michelle Allison (Key: [id number]) Rx #: [id number] Aimovig 140MG /ML auto-injectors   Form [employer] Electronic PA Form Created 8 hours ago Sent to Plan 21 minutes ago Plan Response 20 minutes ago Submit Clinical Questions 8 minutes ago Determination Favorable 7 minutes ago Message from Plan CaseId:[id number];Status:Approved;Review Type:Prior Auth;Coverage Start Date:12/17/2019;Coverage End Date:01/15/2021;

## 2020-02-06 ENCOUNTER — Telehealth: Payer: BC Managed Care – PPO | Admitting: Neurology

## 2020-02-15 DIAGNOSIS — Z23 Encounter for immunization: Secondary | ICD-10-CM | POA: Diagnosis not present

## 2020-03-14 DIAGNOSIS — Z23 Encounter for immunization: Secondary | ICD-10-CM | POA: Diagnosis not present

## 2020-05-14 NOTE — Progress Notes (Deleted)
NEUROLOGY FOLLOW UP OFFICE NOTE  Michelle Allison [id number]  HISTORY OF PRESENT ILLNESS: Michelle Allison is a 59 year old woman whofollows up for chronic migraines.  UPDATE: Ajovy was changed to Aimovig 140mg  in January.  ***.   Frequency of abortive medication:  No more than 2 times a week. Current NSAIDS:ibuprofen Current analgesics:none Current triptans:sumatriptan 100mg  (helped a little bit) Current ergotamine:none Current anti-emetic:none Current muscle relaxants:none Current anti-anxiolytic:none Current sleep aide:none Current Antihypertensive medications:none Current Antidepressant medications:Celexa 20mg  Current Anticonvulsant medications:none Current anti-CGRP:Ajovy (s/p 3 injections) Current Vitamins/Herbal/Supplements:none Current Antihistamines/Decongestants:Nasal spray Other therapy:none Hormone/birth control:none Other medications:Synthroid  Caffeine:No caffeine Depression:no; Anxiety:no Other pain:Left hip/leg pain Sleep hygiene:good  HISTORY: She has had dailypersistentheadaches since she was a teenager, during puberty. She had CT head at the time which was unremarkable. They are in the back of her head but also side and front.It can fluctuate from 4 to 10. It can be a squeezing pain. Not a thunderclap headache or worst headache of her life.Severe fluctuations vary in frequency, typically lasting days.There is no aura.No associated nausea, vomiting, photophobia, phonophobia, visual disturbance, unilateral numbness or weakness.Triggers/aggravating factors includestanding up, movement, menstrual period, certain sugars. She has some relief with ibuprofen. She has seen an allergist for possible triggers. Environmental allergy testing demonstrated mild allergy to mold intradermally and slightly positive to casein, shellfish and tree nuts, but not significant enough to suspect the cause of her  headaches.  MRI of brain with and without contrast from 10/19/2019 showed incidental 6 mm pineal cyst but otherwise unremarkable. CT myelogram from 11/07/2019 was negative for CSF leak.She does have degenerative changes   Past NSAIDS:Indomethacin, Aleve Past analgesics:Tylenol, Excedrin Migraine, morphine Past abortive triptans:sumatriptan 100mg  (ineffective) Past abortive ergotamine:none Past muscle relaxants:none Past anti-emetic:none Past antihypertensive medications:propranolol ER 80mg  Past antidepressant medications:Nortriptyline Past anticonvulsant medications:topiramate (confusion) Past anti-CGRP:none Past vitamins/Herbal/Supplements:none Past antihistamines/decongestants:none Other past therapies:none   Family history of headache:Brother has headaches due to CSF leaks.  PAST MEDICAL HISTORY: Past Medical History:  Diagnosis Date  . Headache(784.0)    24 hours a day      MEDICATIONS: Current Outpatient Medications on File Prior to Visit  Medication Sig Dispense Refill  . citalopram (CELEXA) 20 MG tablet TAKE 1 TABLET BY MOUTH ONCE DAILY FOR DEPRESSION    . Erenumab-aooe (AIMOVIG) 140 MG/ML SOAJ Inject 140 mg into the skin every 30 (thirty) days. 1 pen 11  . Fremanezumab-vfrm (AJOVY) 225 MG/1.5ML SOAJ Inject 225 mg into the skin every 30 (thirty) days. 1 pen 11  . levothyroxine (SYNTHROID) 75 MCG tablet TAKE 1 TABLET BY MOUTH IN THE MORNING FOR HYPOTHYROIDISM    . propranolol ER (INDERAL LA) 80 MG 24 hr capsule Take 1 capsule (80 mg total) by mouth daily. (Patient not taking: Reported on 11/14/2019) 30 capsule 1   No current facility-administered medications on file prior to visit.    ALLERGIES: Allergies  Allergen Reactions  . Augmentin [Amoxicillin-Pot Clavulanate] Rash    FAMILY HISTORY: Family History  Problem Relation Age of Onset  . Arthritis Father   . Headache Brother   . Lymphoma Other    SOCIAL HISTORY: Social  History   Socioeconomic History  . Marital status: Married    Spouse name: 10/21/2019  . Number of children: 3  . Years of education: 16  . Highest education level: Bachelor's degree (e.g., BA, AB, BS)  Occupational History  . Not on file  Tobacco Use  . Smoking status: Never Smoker  . Smokeless tobacco: Never  Used  Substance and Sexual Activity  . Alcohol use: No  . Drug use: No  . Sexual activity: Not on file  Other Topics Concern  . Not on file  Social History Narrative   Patient is right-handed. She lives with her husband and her 3 children in a 2 level home. 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 She drinks hot tea and ice tea daily. She works in her garden and rides horses.   Social Determinants of Health   Financial Resource Strain:   . Difficulty of Paying Living Expenses:   Food Insecurity:   . Worried About Charity fundraiser in the Last Year:   . Arboriculturist in the Last Year:   Transportation Needs:   . Film/video editor (Medical):   Marland Kitchen Lack of Transportation (Non-Medical):   Physical Activity:   . Days of Exercise per Week:   . Minutes of Exercise per Session:   Stress:   . Feeling of Stress :   Social Connections:   . Frequency of Communication with Friends and Family:   . Frequency of Social Gatherings with Friends and Family:   . Attends Religious Services:   . Active Member of Clubs or Organizations:   . Attends Archivist Meetings:   Marland Kitchen Marital Status:   Intimate Partner Violence:   . Fear of Current or Ex-Partner:   . Emotionally Abused:   Marland Kitchen Physically Abused:   . Sexually Abused:     REVIEW OF SYSTEMS: Constitutional: No fevers, chills, or sweats, no generalized fatigue, change in appetite Eyes: No visual changes, double vision, eye pain Ear, nose and throat: No hearing loss, ear pain, nasal congestion, sore throat Cardiovascular: No chest pain, palpitations Respiratory:  No shortness of breath at rest or with exertion,  wheezes GastrointestinaI: No nausea, vomiting, diarrhea, abdominal pain, fecal incontinence Genitourinary:  No dysuria, urinary retention or frequency Musculoskeletal:  No neck pain, back pain Integumentary: No rash, pruritus, skin lesions Neurological: as above Psychiatric: No depression, insomnia, anxiety Endocrine: No palpitations, fatigue, diaphoresis, mood swings, change in appetite, change in weight, increased thirst Hematologic/Lymphatic:  No purpura, petechiae. Allergic/Immunologic: no itchy/runny eyes, nasal congestion, recent allergic reactions, rashes  PHYSICAL EXAM: *** General: No acute distress.  Patient appears well-groomed.   Head:  Normocephalic/atraumatic Eyes:  Fundi examined but not visualized Neck: supple, no paraspinal tenderness, full range of motion Heart:  Regular rate and rhythm Lungs:  Clear to auscultation bilaterally Back: No paraspinal tenderness Neurological Exam: alert and oriented to person, place, and time. Attention span and concentration intact, recent and remote memory intact, fund of knowledge intact.  Speech fluent and not dysarthric, language intact.  CN II-XII intact. Bulk and tone normal, muscle strength 5/5 throughout.  Sensation to light touch, temperature and vibration intact.  Deep tendon reflexes 2+ throughout, toes downgoing.  Finger to nose and heel to shin testing intact.  Gait normal, Romberg negative.  IMPRESSION: Migraine without aura, without status migrainosus, not intractable  PLAN: 1.  For preventative management, *** 2.  For abortive therapy, *** 3.  Limit use of pain relievers to no more than 2 days out of week to prevent risk of rebound or medication-overuse headache. 4.  Keep headache diary 5.  Exercise, hydration, caffeine cessation, sleep hygiene, monitor for and avoid triggers 6.  Follow up ***   Metta Clines, DO  CC: Gar Ponto, MD

## 2020-05-15 ENCOUNTER — Ambulatory Visit: Payer: BC Managed Care – PPO | Admitting: Neurology

## 2020-05-16 DIAGNOSIS — L570 Actinic keratosis: Secondary | ICD-10-CM | POA: Diagnosis not present

## 2020-05-16 DIAGNOSIS — L814 Other melanin hyperpigmentation: Secondary | ICD-10-CM | POA: Diagnosis not present

## 2020-05-16 NOTE — Progress Notes (Signed)
NEUROLOGY FOLLOW UP OFFICE NOTE  Michelle Allison [id number]  HISTORY OF PRESENT ILLNESS: Michelle Allison is a 59 year old woman whofollows up for chronic migraines.  UPDATE: Ajovy was changed to Aimovig 140mg  in January.  However, she only took the samples so her last sample was April 9.  She never picked up the prescription (never got a call from pharmacy).  She thinks it may have helped but not much.  However, she wasn't taking ibuprofen as much as she had less intense days.  Since then, head hurts diffusely all the time, more severe occiptal days.  She reports right sided shoulder and neck pain.    Frequency of abortive medication:  Sometimes takes frequently Current NSAIDS:ibuprofen Current analgesics:none Current triptans:none Current ergotamine:none Current anti-emetic:none Current muscle relaxants:none Current anti-anxiolytic:none Current sleep aide:none Current Antihypertensive medications:none Current Antidepressant medications:Celexa 20mg  Current Anticonvulsant medications:none Current anti-CGRP:none Current Vitamins/Herbal/Supplements:none Current Antihistamines/Decongestants:Nasal spray Other therapy:none Hormone/birth control:none Other medications:Synthroid  Caffeine:No caffeine Depression:no; Anxiety:no Other pain:Left hip/leg pain Sleep hygiene:good  HISTORY: She has had dailypersistentheadaches since she was a teenager, during puberty. She had CT head at the time which was unremarkable. They are in the back of her head but also side and front.It can fluctuate from 4 to 10. It can be a squeezing pain. Not a thunderclap headache or worst headache of her life.Severe fluctuations vary in frequency, typically lasting days.There is no aura.No associated nausea, vomiting, photophobia, phonophobia, visual disturbance, unilateral numbness or weakness.Triggers/aggravating factors includestanding up,  movement, menstrual period, certain sugars. She has some relief with ibuprofen. She has seen an allergist for possible triggers. Environmental allergy testing demonstrated mild allergy to mold intradermally and slightly positive to casein, shellfish and tree nuts, but not significant enough to suspect the cause of her headaches.  MRI of brain with and without contrast from 10/19/2019 showed incidental 6 mm pineal cyst but otherwise unremarkable.  CT myelogram from 11/07/2019 was negative for CSF leak.She does have degenerative changes   Past NSAIDS:Indomethacin, Aleve Past analgesics:Tylenol, Excedrin Migraine, morphine Past abortive triptans:sumatriptan 100mg  (ineffective) Past abortive ergotamine:none Past muscle relaxants:none Past anti-emetic:none Past antihypertensive medications:propranolol ER 80mg  Past antidepressant medications:Nortriptyline Past anticonvulsant medications:topiramate (confusion) Past anti-CGRP:Ajovy (s/p 3 injections); Aimovig 140mg  (s/p 3 injections) Past vitamins/Herbal/Supplements:none Past antihistamines/decongestants:none Other past therapies:none   Family history of headache:Brother has headaches due to CSF leaks.  PAST MEDICAL HISTORY: Past Medical History:  Diagnosis Date  . Headache(784.0)    24 hours a day      MEDICATIONS: Current Outpatient Medications on File Prior to Visit  Medication Sig Dispense Refill  . citalopram (CELEXA) 20 MG tablet TAKE 1 TABLET BY MOUTH ONCE DAILY FOR DEPRESSION    . Erenumab-aooe (AIMOVIG) 140 MG/ML SOAJ Inject 140 mg into the skin every 30 (thirty) days. 1 pen 11  . Fremanezumab-vfrm (AJOVY) 225 MG/1.5ML SOAJ Inject 225 mg into the skin every 30 (thirty) days. 1 pen 11  . levothyroxine (SYNTHROID) 75 MCG tablet TAKE 1 TABLET BY MOUTH IN THE MORNING FOR HYPOTHYROIDISM    . propranolol ER (INDERAL LA) 80 MG 24 hr capsule Take 1 capsule (80 mg total) by mouth daily. (Patient not  taking: Reported on 11/14/2019) 30 capsule 1   No current facility-administered medications on file prior to visit.    ALLERGIES: Allergies  Allergen Reactions  . Augmentin [Amoxicillin-Pot Clavulanate] Rash    FAMILY HISTORY: Family History  Problem Relation Age of Onset  . Arthritis Father   . Headache Brother   .  Lymphoma Other    SOCIAL HISTORY: Social History   Socioeconomic History  . Marital status: Married    Spouse name: Michelle Allison  . Number of children: 3  . Years of education: 16  . Highest education level: Bachelor's degree (e.g., BA, AB, BS)  Occupational History  . Not on file  Tobacco Use  . Smoking status: Never Smoker  . Smokeless tobacco: Never Used  Substance and Sexual Activity  . Alcohol use: No  . Drug use: No  . Sexual activity: Not on file  Other Topics Concern  . Not on file  Social History Narrative   Patient is right-handed. She lives with her husband and her 3 children in a 2 level home. 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 She drinks hot tea and ice tea daily. She works in her garden and rides horses.   Social Determinants of Health   Financial Resource Strain:   . Difficulty of Paying Living Expenses:   Food Insecurity:   . Worried About Programme researcher, broadcasting/film/video in the Last Year:   . Barista in the Last Year:   Transportation Needs:   . Freight forwarder (Medical):   Marland Kitchen Lack of Transportation (Non-Medical):   Physical Activity:   . Days of Exercise per Week:   . Minutes of Exercise per Session:   Stress:   . Feeling of Stress :   Social Connections:   . Frequency of Communication with Friends and Family:   . Frequency of Social Gatherings with Friends and Family:   . Attends Religious Services:   . Active Member of Clubs or Organizations:   . Attends Banker Meetings:   Marland Kitchen Marital Status:   Intimate Partner Violence:   . Fear of Current or Ex-Partner:   . Emotionally Abused:   Marland Kitchen Physically Abused:   . Sexually  Abused:     PHYSICAL EXAM: Blood pressure 112/77, pulse 73, resp. rate 18, height 5\' 5"  (1.651 m), weight 195 lb (88.5 kg), SpO2 96 %. General: No acute distress.  Patient appears well-groomed.   Head:  Normocephalic/atraumatic Eyes:  Fundi examined but not visualized Neck: supple, no paraspinal tenderness, full range of motion Heart:  Regular rate and rhythm Lungs:  Clear to auscultation bilaterally Back: No paraspinal tenderness Neurological Exam: alert and oriented to person, place, and time. Attention span and concentration intact, recent and remote memory intact, fund of knowledge intact.  Speech fluent and not dysarthric, language intact.  CN II-XII intact. Bulk and tone normal, muscle strength 5/5 throughout.  Sensation to light touch, temperature and vibration intact.  Deep tendon reflexes 2+ throughout, toes downgoing.  Finger to nose and heel to shin testing intact.  Gait normal, Romberg negative.  IMPRESSION: Chronic migraine without aura, without status migrainosus, not intractable.  She has had daily headaches for well over 3 consecutive months and has failed multiple preventatives such as Aimovig, Ajovy, nortriptyline, propranolol, topiramate.  She meets criteria for Botox.  PLAN: 1.  For preventative management, plan for Botox.  2.  For abortive therapy, she will try rizatriptan.  If ineffective, I have also given her samples of Nurtec. 3. Tizanidine 4mg  at bedtime for neck pain 4.  Limit use of pain relievers to no more than 2 days out of week to prevent risk of rebound or medication-overuse headache. 5.  Keep headache diary 6.  Exercise, hydration, caffeine cessation, sleep hygiene, monitor for and avoid triggers 7.  Follow up for Botox  Shon Millet, DO  CC: Donzetta Sprung, MD

## 2020-05-17 ENCOUNTER — Encounter: Payer: Self-pay | Admitting: Neurology

## 2020-05-17 ENCOUNTER — Other Ambulatory Visit: Payer: Self-pay

## 2020-05-17 ENCOUNTER — Ambulatory Visit (INDEPENDENT_AMBULATORY_CARE_PROVIDER_SITE_OTHER): Payer: BC Managed Care – PPO | Admitting: Neurology

## 2020-05-17 VITALS — BP 112/77 | HR 73 | Resp 18 | Ht 65.0 in | Wt 195.0 lb

## 2020-05-17 DIAGNOSIS — M7918 Myalgia, other site: Secondary | ICD-10-CM | POA: Diagnosis not present

## 2020-05-17 DIAGNOSIS — G43711 Chronic migraine without aura, intractable, with status migrainosus: Secondary | ICD-10-CM

## 2020-05-17 MED ORDER — RIZATRIPTAN BENZOATE 10 MG PO TABS: ORAL_TABLET | ORAL | 3 refills | Status: AC

## 2020-05-17 MED ORDER — TIZANIDINE HCL 4 MG PO TABS: 4.0000 mg | ORAL_TABLET | Freq: Every day | ORAL | 4 refills | Status: AC

## 2020-05-17 NOTE — Patient Instructions (Signed)
1.  Set up for Botox 2.  Take rizatriptan at earliest onset of migraine.  May repeat in 2 hours if needed. 3.  Try taking Nurtec for rescue.  No more than 1 in 24 hours.  If effective, contact me for prescription 4  Take tizanidine 4mg  at bedtime for neck pain.  Caution for drowsiness.

## 2020-07-23 DIAGNOSIS — M25511 Pain in right shoulder: Secondary | ICD-10-CM | POA: Diagnosis not present

## 2020-07-23 DIAGNOSIS — S060X9A Concussion with loss of consciousness of unspecified duration, initial encounter: Secondary | ICD-10-CM | POA: Diagnosis not present

## 2020-09-10 ENCOUNTER — Encounter: Payer: Self-pay | Admitting: Neurology

## 2020-09-10 NOTE — Progress Notes (Signed)
Burlene Arnt KeyMardene Celeste - [place] Case ID: [id number] Need help? Call us at [phone] Status Additional Information Required Drug Ajovy 225MG /1.5ML syringes Form [facility] Electronic [place] Form [phone] NCPDP)

## 2020-11-05 DIAGNOSIS — Z20828 Contact with and (suspected) exposure to other viral communicable diseases: Secondary | ICD-10-CM | POA: Diagnosis not present

## 2020-11-16 DIAGNOSIS — E039 Hypothyroidism, unspecified: Secondary | ICD-10-CM | POA: Diagnosis not present

## 2020-11-16 DIAGNOSIS — Z0001 Encounter for general adult medical examination with abnormal findings: Secondary | ICD-10-CM | POA: Diagnosis not present

## 2020-11-16 DIAGNOSIS — I10 Essential (primary) hypertension: Secondary | ICD-10-CM | POA: Diagnosis not present

## 2020-11-29 DIAGNOSIS — Z0000 Encounter for general adult medical examination without abnormal findings: Secondary | ICD-10-CM | POA: Diagnosis not present

## 2020-12-19 ENCOUNTER — Encounter: Payer: Self-pay | Admitting: Neurology

## 2020-12-19 NOTE — Progress Notes (Signed)
Burlene Arnt (Key: [id number]) Aimovig 140MG /ML auto-injectors   Form [facility] Electronic [place] Form [phone] NCPDP) Created 1 day ago Sent to Plan 4 minutes ago Plan Response 3 minutes ago Submit Clinical Questions 2 minutes ago Determination Favorable 2 minutes ago Message from Plan [facility];Review Type:Prior Auth;Coverage Start Date:11/19/2020;Coverage End Date:12/19/2021;

## 2021-06-17 IMAGING — CT CT CERVICAL SPINE W/ CM
2 series · 10 of 14 positions shown, 12 images · non-contrast
Comparison: none

CLINICAL DATA: Recurrent headaches for several decades. Family
history of CSF leak. No previous spinal surgery.
TECHNIQUE: Contiguous axial images were obtained through the Cervical and
Lumbar spine without infusion. Coronal and sagittal reconstructions
were obtained of the axial image sets through the Cervical spine.
Coronal, sagittal, and disc space reconstructions were obtained of
the axial image sets through the Lumbar spine.

[Series 3: cspine soft · axial · 0.28mm/px · z∈[-207,-87]mm · 5 of 90 slices shown]
[im 15/90  soft-tissue]
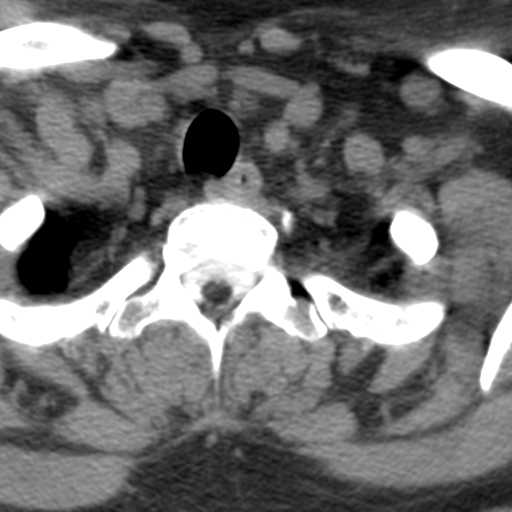
[im 30/90  soft-tissue]
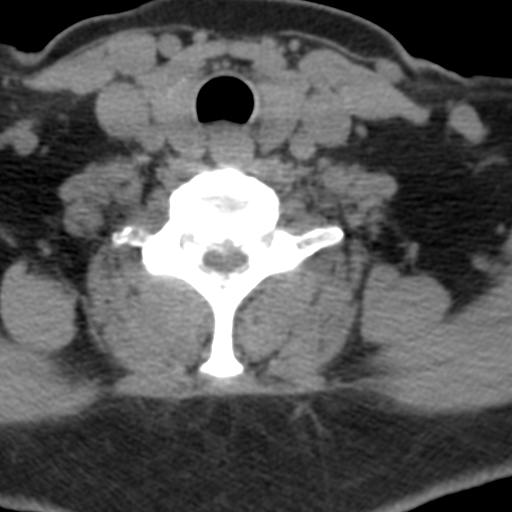
[im 45/90  soft-tissue]
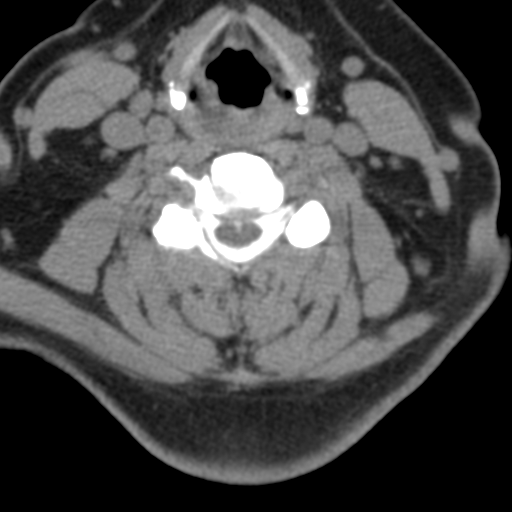
[im 60/90  soft-tissue]
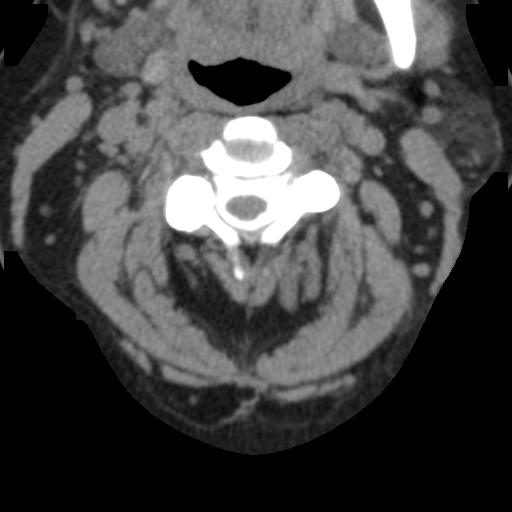
[im 75/90  soft-tissue]
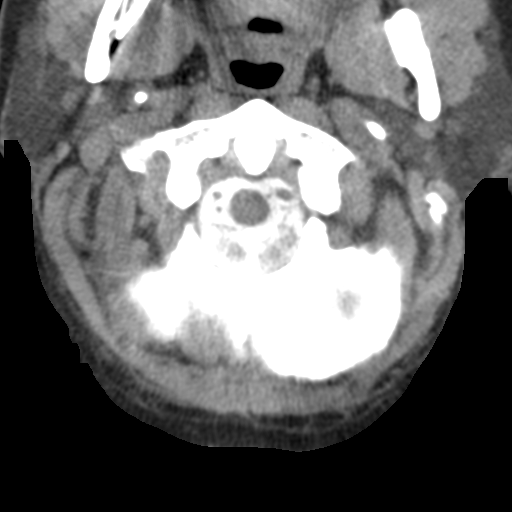

[Series 9: angled axial · axial · 0.29mm/px · z∈[-218,-99]mm · 5 of 91 slices shown, 7 images]
[im 16/91  soft-tissue]
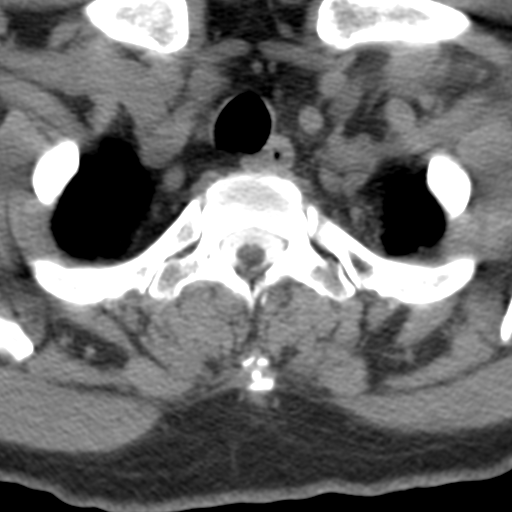
[im 16/91  bone]
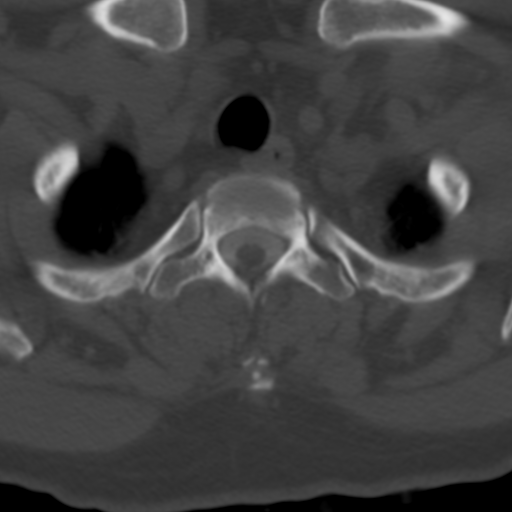
[im 31/91  bone]
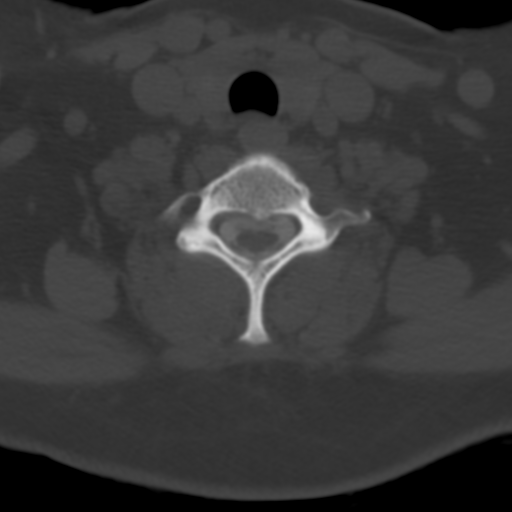
[im 46/91  bone]
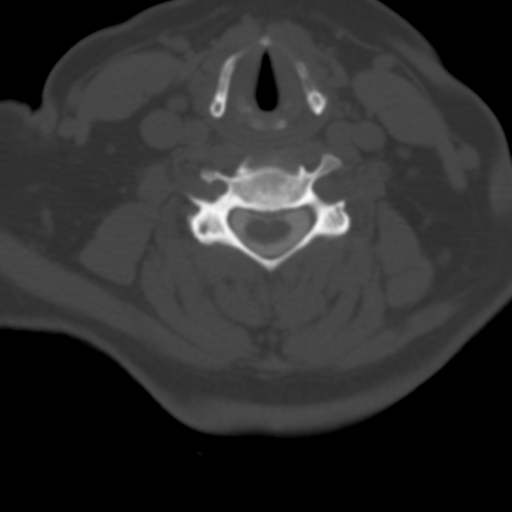
[im 61/91  bone]
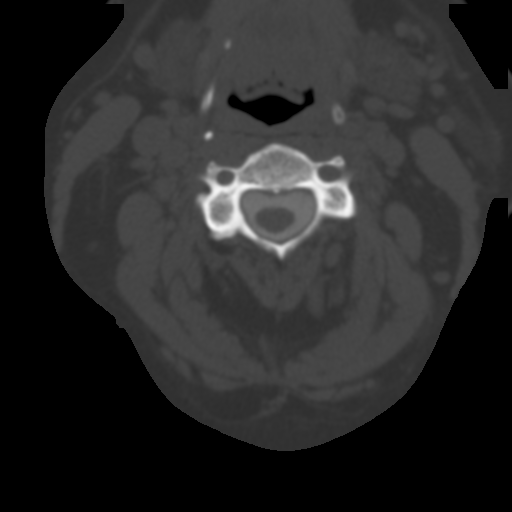
[im 76/91  soft-tissue]
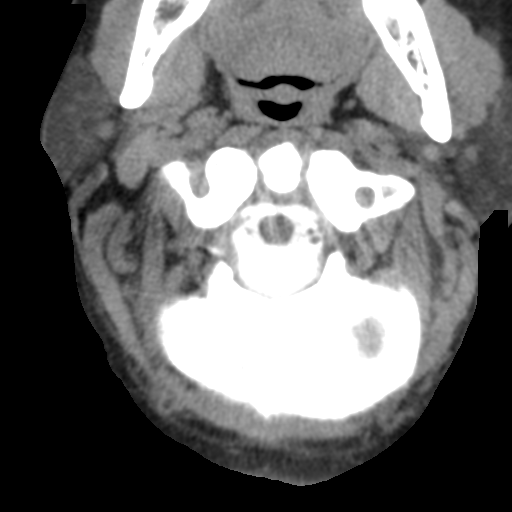
[im 76/91  bone]
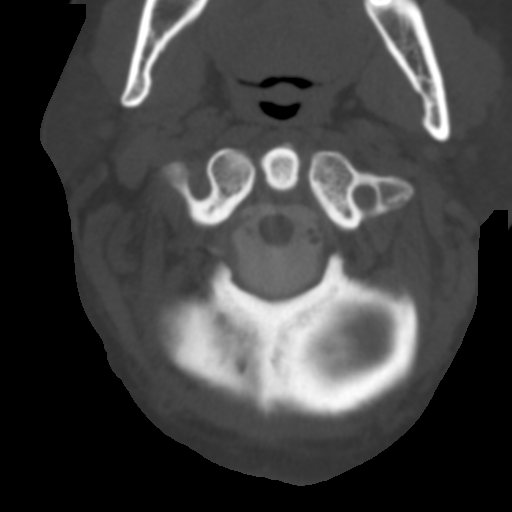

[10 of 14 positions shown; findings below may reference images not displayed]

FLUOROSCOPY TIME:  4 minutes 46 seconds; 0902 uSym6 DAP

PROCEDURE:
LUMBAR PUNCTURE FOR CERVICAL LUMBAR AND THORACIC MYELOGRAM

CERVICAL AND LUMBAR AND THORACIC MYELOGRAM

CT CERVICAL MYELOGRAM

CT LUMBAR MYELOGRAM

CT THORACIC MYELOGRAM

After thorough discussion of risks and benefits of the procedure
including bleeding, infection, injury to nerves, blood vessels,
adjacent structures as well as headache and CSF leak, written and
oral informed consent was obtained. Consent was obtained by Dr. AYAKO
Geffrard.

Patient was positioned prone on the fluoroscopy table. Local
anesthesia was provided with 1% lidocaine without epinephrine after
prepped and draped in the usual sterile fashion. Puncture was
performed at L4 using a standard 22-gauge spinal needle via a right
parasagittal approach. Using a single pass through the dura, the
needle was placed within the thecal sac, with return of clear CSF. 8
mL of Gmnipaque-NWW was injected into the thecal sac, with normal
opacification of the nerve roots and cauda equina consistent with
free flow within the subarachnoid space.

I personally performed the lumbar puncture and administered the
intrathecal contrast. I also personally supervised acquisition of
the myelogram images.
FINDINGS: CERVICAL:

Mild reversal of the normal lordosis in the mid third cervical
spine. No spondylolisthesis.

C2-3: Interspace unremarkable. Mild right facet degenerative
spurring. Central canal and foramina patent.

C3-4: Small right paracentral protrusion without cord distortion.
Left facet DJD. No spinal or foraminal stenosis.

C4-5: Broad right posterolateral protrusion approaches the anterior
aspect of the cervical cord. No spinal or foraminal stenosis.

C5-6: Moderate narrowing of the interspace with circumferential disc
bulge and associated anterior and posterior endplate spurring.
Central canal and foramina patent.

C6-7: Broad right paracentral partially calcified protrusion
approaches the anterior aspect of the cervical cord without
significant flattening or distortion. No spinal or foraminal
stenosis.

C7-T1: Unremarkable

THORACIC:

12 rib-bearing thoracic segments assigned T1-T12. Normal alignment.

Early anterior endplate spurs T3-T11. No other significant osseous
degenerative change.

Vertebral body height maintained throughout. Interspaces
unremarkable. No spinal stenosis. Small amount of dense epidural
contrast from mixed injection extends dorsally up to the T7 level
without mass effect.

Visualized portions of lungs and paraspinal soft tissues
unremarkable.

LUMBAR:

5 non rib-bearing lumbar segments assigned L1-L5. Normal alignment.
Mixed injection with anterior and dorsal epidural contrast, more
dense than that in the thecal sac.

T12-L1: Negative

L1-2: Negative. Conus terminates at the interspace.

L2-3: Negative

L3-4: Interspace unremarkable. Mild right facet DJD. No spinal or
foraminal stenosis.

L4-5: Mild circumferential disc bulge. Early facet DJD contributing
to mild central canal stenosis. Foramina patent.

L5-S1: Unremarkable

Visualized paraspinal soft tissues unremarkable.
IMPRESSION: 1. No convincing evidence of spontaneous CSF leak.
2. Cervical degenerative disc disease C3-C7, without spinal or
foraminal stenosis.
3. Usual anterior endplate spurring in the thoracic spine without
acute finding.
4. Mild multifactorial spinal stenosis L4-5.

## 2021-06-17 IMAGING — XA DG MYELOGRAPHY LUMBAR INJ MULTI REGION
13 of 16 series · 13 of 16 positions shown · non-contrast
Comparison: none

CLINICAL DATA: Recurrent headaches for several decades. Family
history of CSF leak. No previous spinal surgery.
TECHNIQUE: Contiguous axial images were obtained through the Cervical and
Lumbar spine without infusion. Coronal and sagittal reconstructions
were obtained of the axial image sets through the Cervical spine.
Coronal, sagittal, and disc space reconstructions were obtained of
the axial image sets through the Lumbar spine.

[Series 2: vasc adipose · 1 of 1 slices shown (1 of 13)]
[im 1/1]
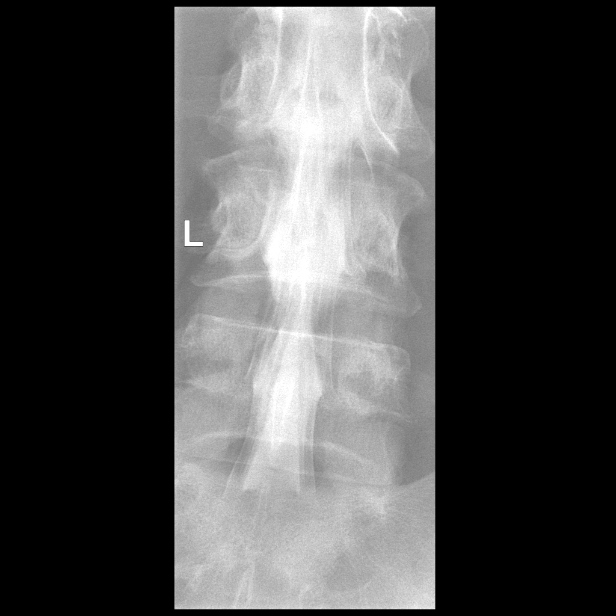

[Series 4: vasc adipose · 1 of 1 slices shown (2 of 13)]
[im 1/1]
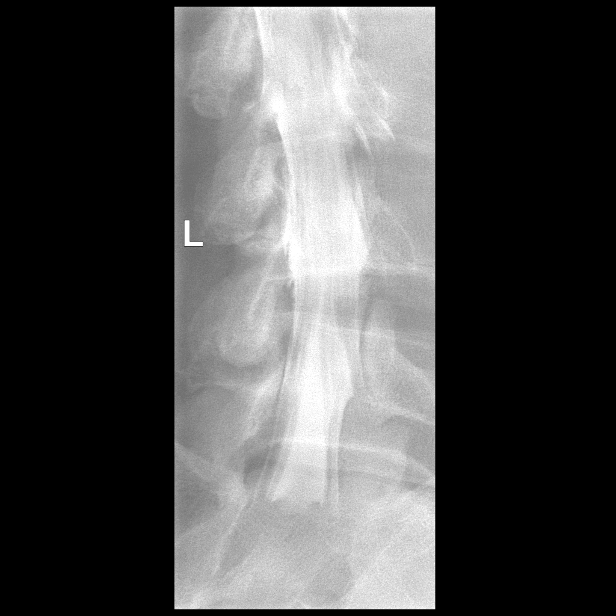

[Series 6: vasc adipose · 1 of 1 slices shown (3 of 13)]
[im 1/1]
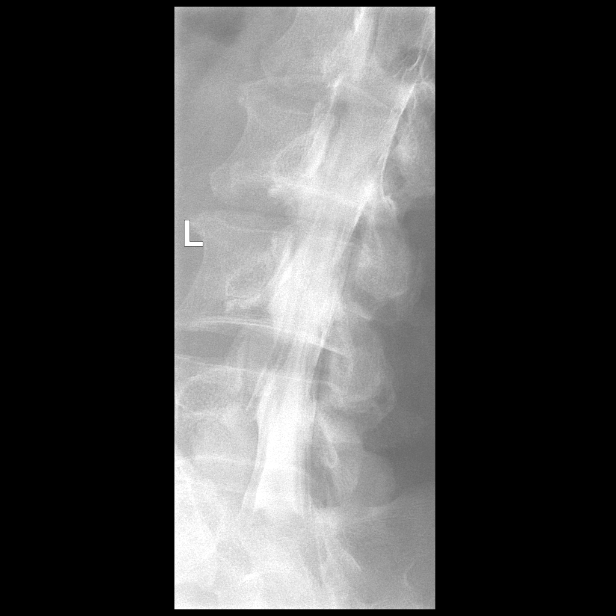

[Series 8: vasc adipose · 1 of 1 slices shown (4 of 13)]
[im 1/1]
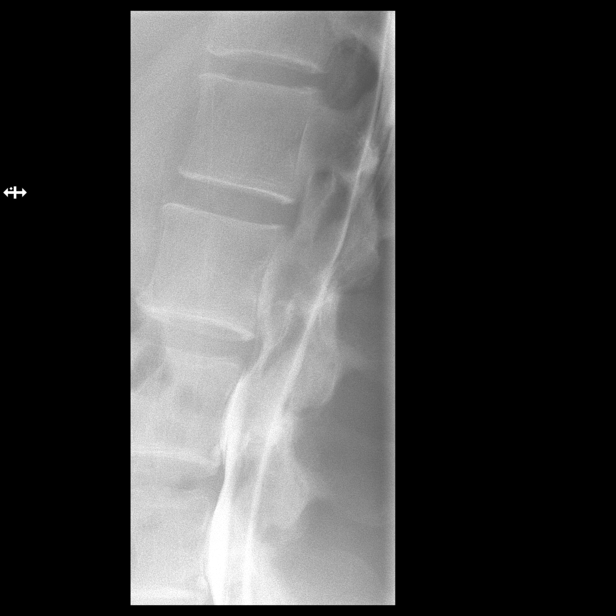

[Series 9: vasc adipose · 1 of 1 slices shown (5 of 13)]
[im 1/1]
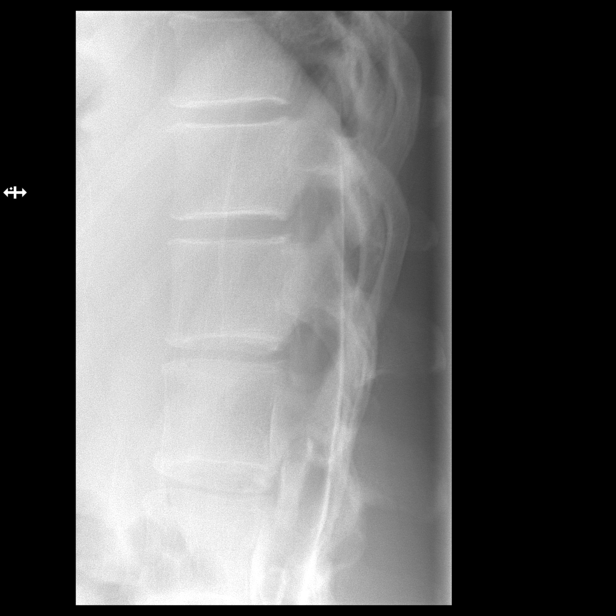

[Series 12: vasc adipose · 1 of 1 slices shown (6 of 13)]
[im 1/1]
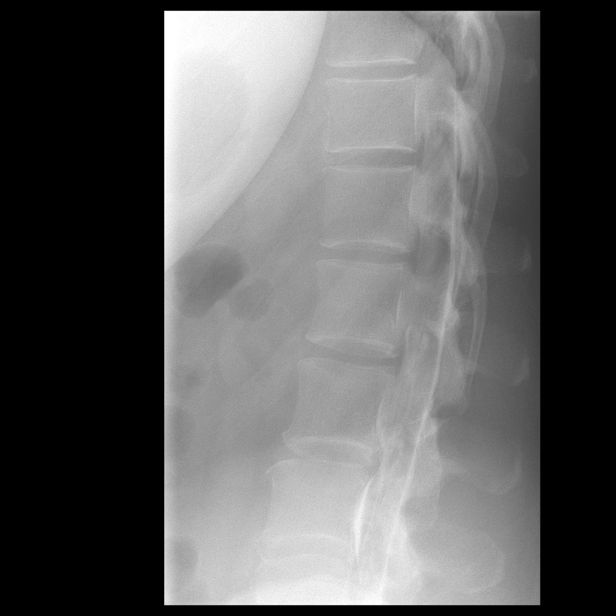

[Series 14: vasc adipose · 1 of 1 slices shown (7 of 13)]
[im 1/1]
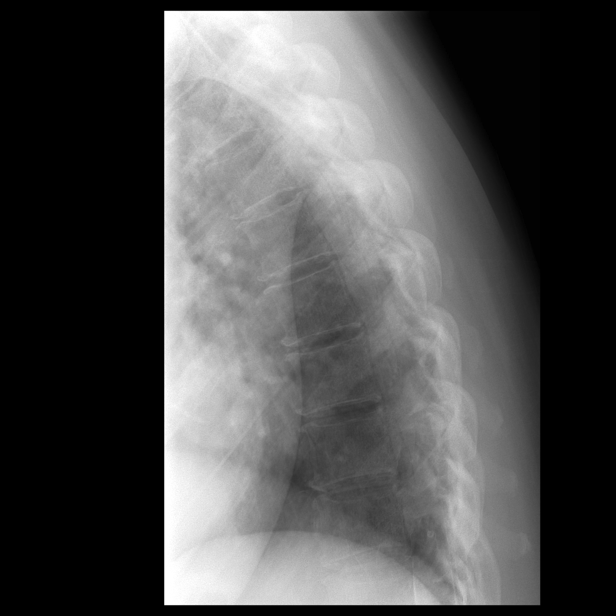

[Series 15: vasc adipose · 1 of 1 slices shown (8 of 13)]
[im 1/1]
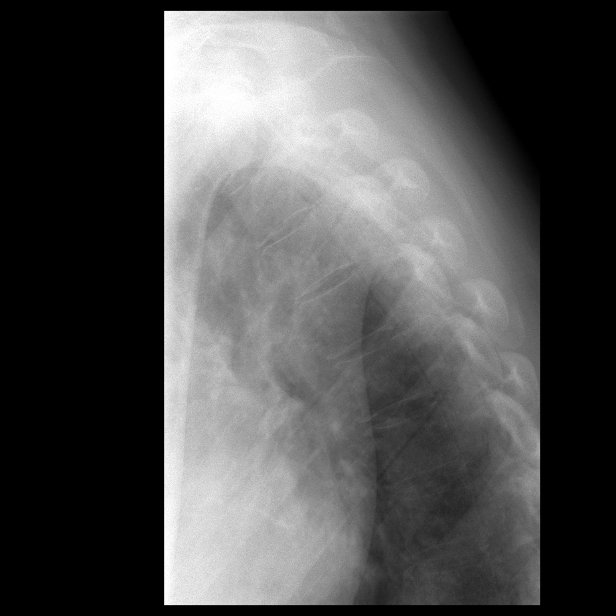

[Series 16: vasc adipose · 1 of 1 slices shown (9 of 13)]
[im 1/1]
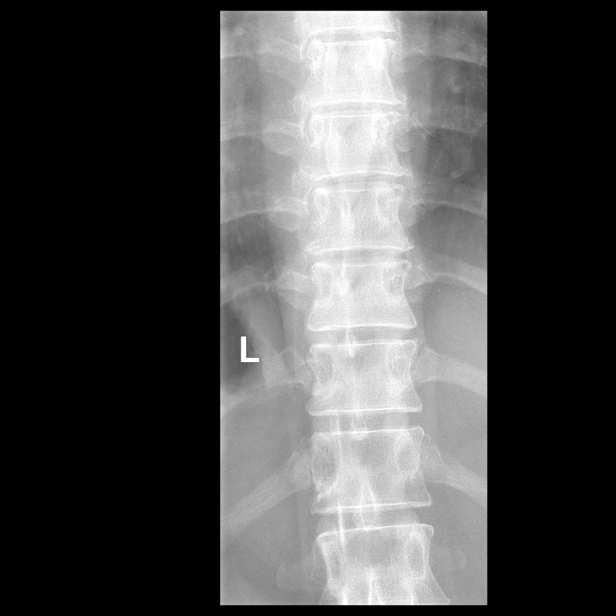

[Series 17: vasc adipose · 1 of 1 slices shown (10 of 13)]
[im 1/1]
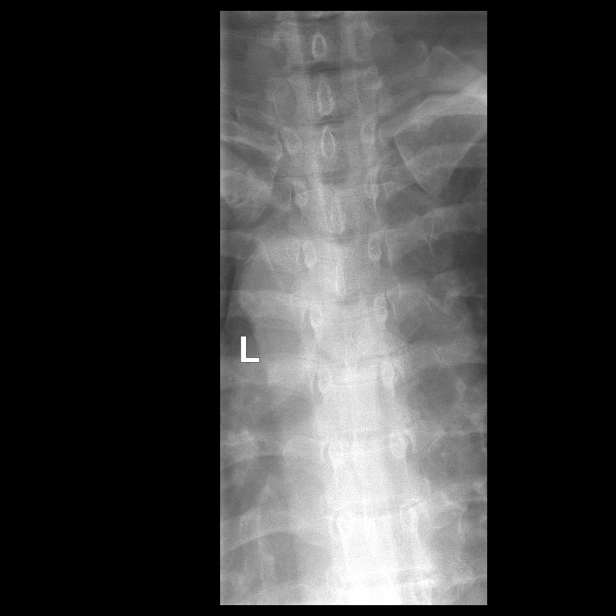

[Series 19: vasc adipose · 1 of 1 slices shown (11 of 13)]
[im 1/1]
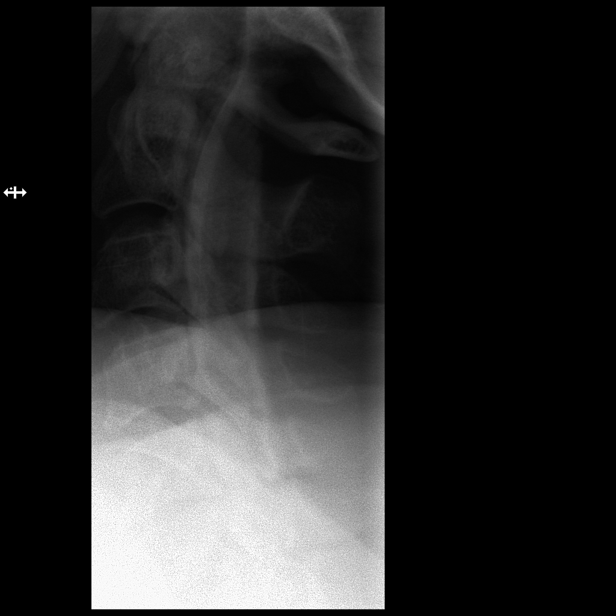

[Series 24: vasc adipose · 1 of 1 slices shown (12 of 13)]
[im 1/1]
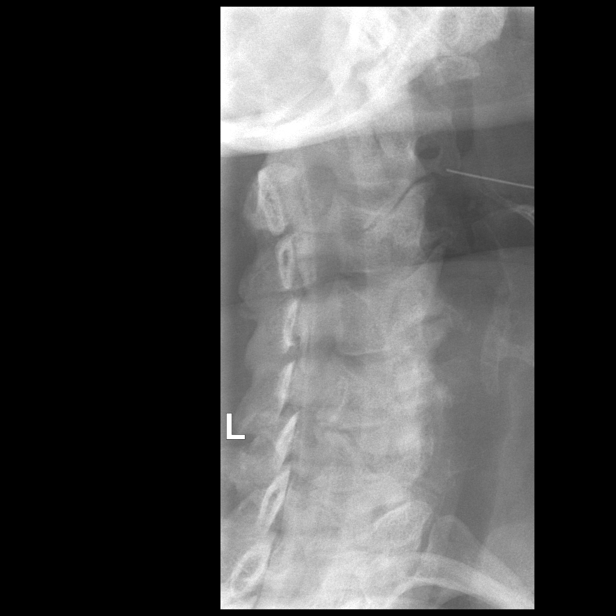

[Series 25: vasc adipose · 1 of 1 slices shown (13 of 13)]
[im 1/1]
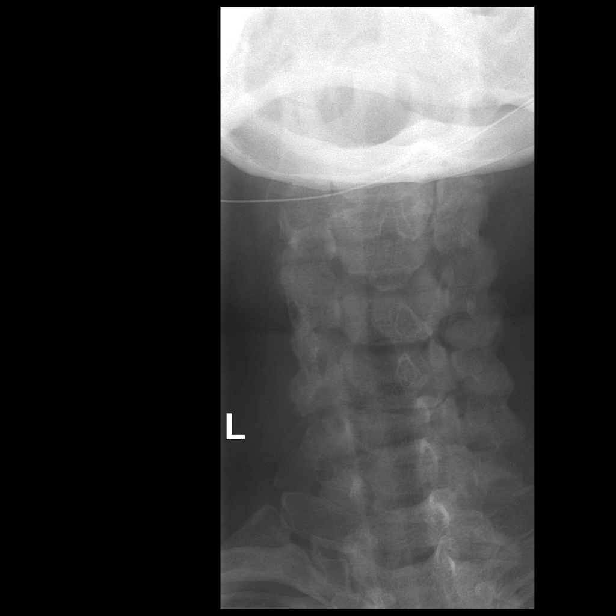

[13 of 16 positions shown; findings below may reference images not displayed]

FLUOROSCOPY TIME:  4 minutes 46 seconds; 0902 uSym6 DAP

PROCEDURE:
LUMBAR PUNCTURE FOR CERVICAL LUMBAR AND THORACIC MYELOGRAM

CERVICAL AND LUMBAR AND THORACIC MYELOGRAM

CT CERVICAL MYELOGRAM

CT LUMBAR MYELOGRAM

CT THORACIC MYELOGRAM

After thorough discussion of risks and benefits of the procedure
including bleeding, infection, injury to nerves, blood vessels,
adjacent structures as well as headache and CSF leak, written and
oral informed consent was obtained. Consent was obtained by Dr. AYAKO
Geffrard.

Patient was positioned prone on the fluoroscopy table. Local
anesthesia was provided with 1% lidocaine without epinephrine after
prepped and draped in the usual sterile fashion. Puncture was
performed at L4 using a standard 22-gauge spinal needle via a right
parasagittal approach. Using a single pass through the dura, the
needle was placed within the thecal sac, with return of clear CSF. 8
mL of Gmnipaque-NWW was injected into the thecal sac, with normal
opacification of the nerve roots and cauda equina consistent with
free flow within the subarachnoid space.

I personally performed the lumbar puncture and administered the
intrathecal contrast. I also personally supervised acquisition of
the myelogram images.
FINDINGS: CERVICAL:

Mild reversal of the normal lordosis in the mid third cervical
spine. No spondylolisthesis.

C2-3: Interspace unremarkable. Mild right facet degenerative
spurring. Central canal and foramina patent.

C3-4: Small right paracentral protrusion without cord distortion.
Left facet DJD. No spinal or foraminal stenosis.

C4-5: Broad right posterolateral protrusion approaches the anterior
aspect of the cervical cord. No spinal or foraminal stenosis.

C5-6: Moderate narrowing of the interspace with circumferential disc
bulge and associated anterior and posterior endplate spurring.
Central canal and foramina patent.

C6-7: Broad right paracentral partially calcified protrusion
approaches the anterior aspect of the cervical cord without
significant flattening or distortion. No spinal or foraminal
stenosis.

C7-T1: Unremarkable

THORACIC:

12 rib-bearing thoracic segments assigned T1-T12. Normal alignment.

Early anterior endplate spurs T3-T11. No other significant osseous
degenerative change.

Vertebral body height maintained throughout. Interspaces
unremarkable. No spinal stenosis. Small amount of dense epidural
contrast from mixed injection extends dorsally up to the T7 level
without mass effect.

Visualized portions of lungs and paraspinal soft tissues
unremarkable.

LUMBAR:

5 non rib-bearing lumbar segments assigned L1-L5. Normal alignment.
Mixed injection with anterior and dorsal epidural contrast, more
dense than that in the thecal sac.

T12-L1: Negative

L1-2: Negative. Conus terminates at the interspace.

L2-3: Negative

L3-4: Interspace unremarkable. Mild right facet DJD. No spinal or
foraminal stenosis.

L4-5: Mild circumferential disc bulge. Early facet DJD contributing
to mild central canal stenosis. Foramina patent.

L5-S1: Unremarkable

Visualized paraspinal soft tissues unremarkable.
IMPRESSION: 1. No convincing evidence of spontaneous CSF leak.
2. Cervical degenerative disc disease C3-C7, without spinal or
foraminal stenosis.
3. Usual anterior endplate spurring in the thoracic spine without
acute finding.
4. Mild multifactorial spinal stenosis L4-5.

## 2021-06-17 IMAGING — CT CT T SPINE W/ CM
1 series · 12 of 14 positions shown, 15 images · non-contrast
Comparison: none

CLINICAL DATA: Recurrent headaches for several decades. Family
history of CSF leak. No previous spinal surgery.
TECHNIQUE: Contiguous axial images were obtained through the Cervical and
Lumbar spine without infusion. Coronal and sagittal reconstructions
were obtained of the axial image sets through the Cervical spine.
Coronal, sagittal, and disc space reconstructions were obtained of
the axial image sets through the Lumbar spine.

[Series 3: t spine soft · axial · 0.31mm/px · z∈[-470,-203]mm · 12 of 107 slices shown, 15 images]
[im 9/107  soft-tissue]
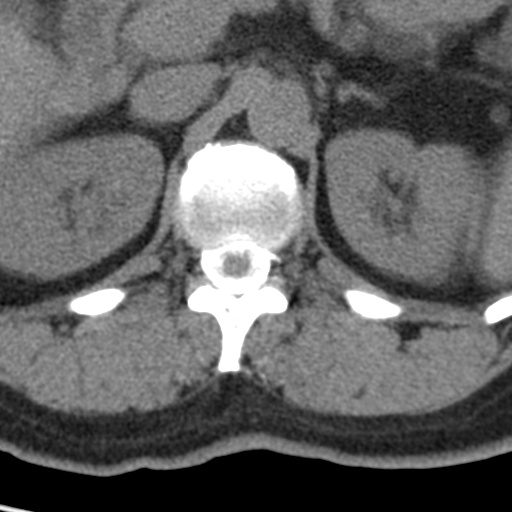
[im 9/107  bone]
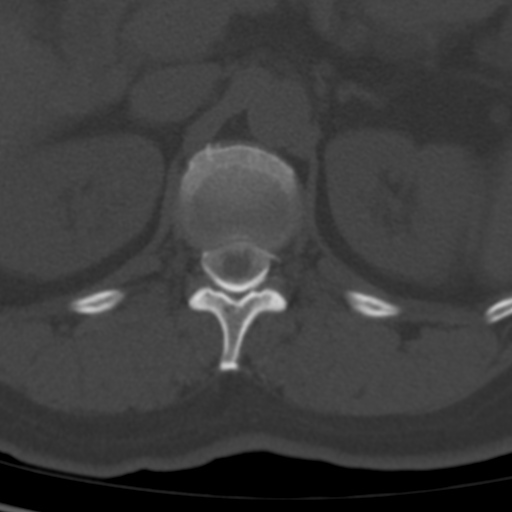
[im 17/107  bone]
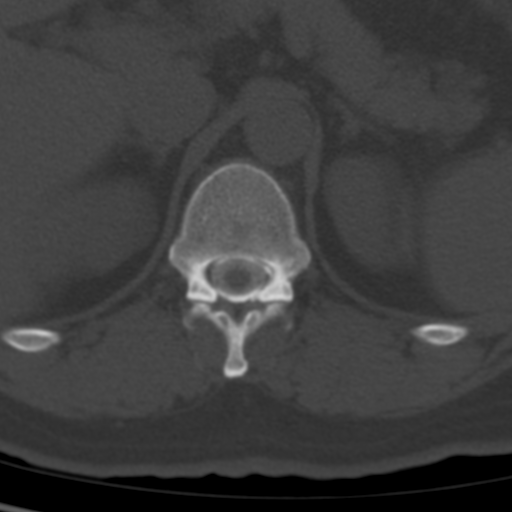
[im 25/107  bone]
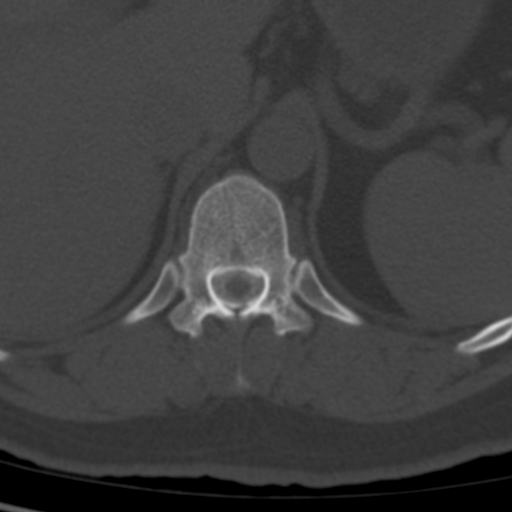
[im 33/107  bone]
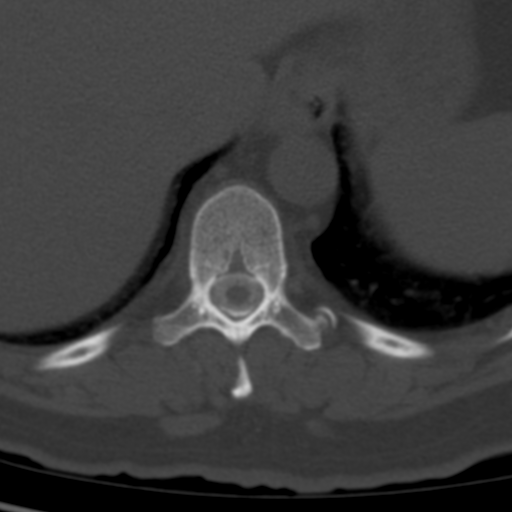
[im 41/107  soft-tissue]
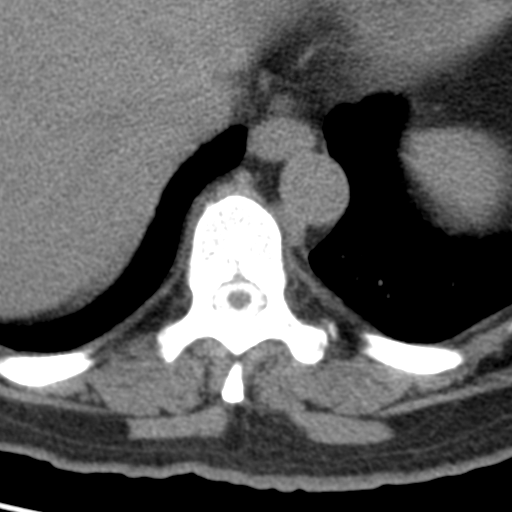
[im 41/107  bone]
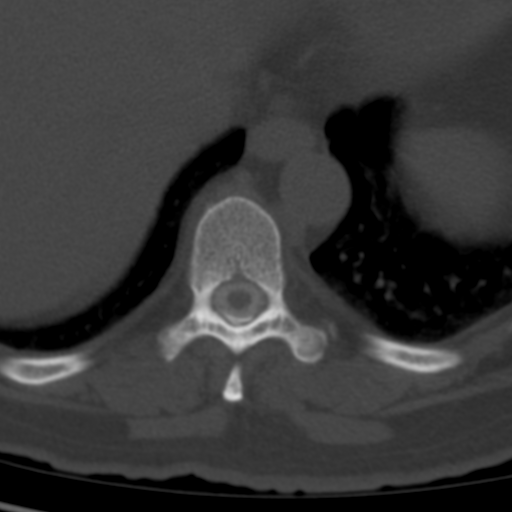
[im 49/107  bone]
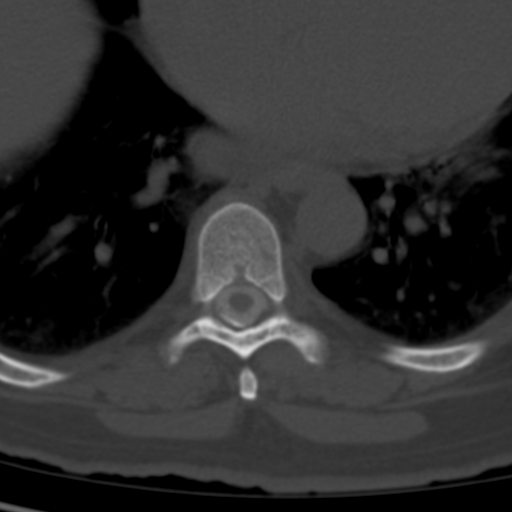
[im 58/107  bone]
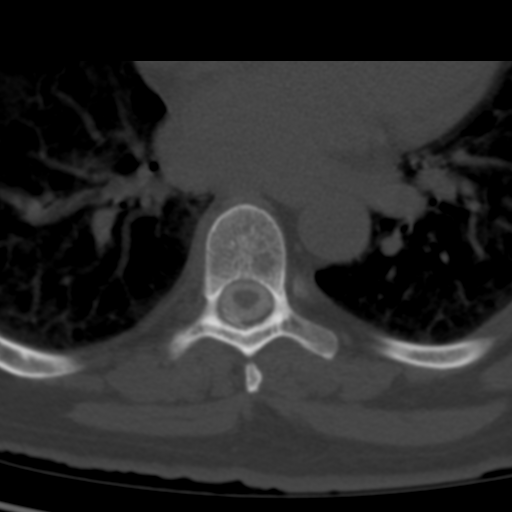
[im 66/107  bone]
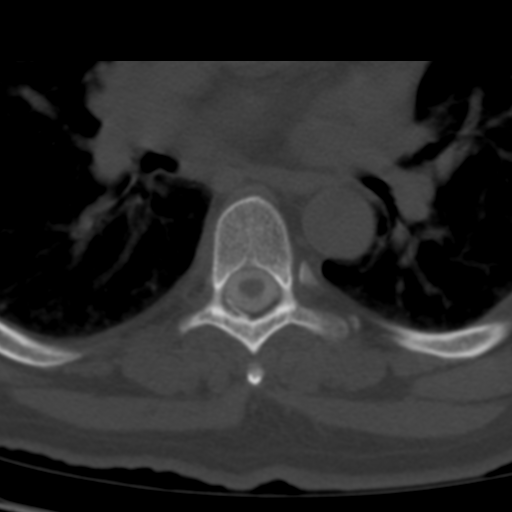
[im 74/107  soft-tissue]
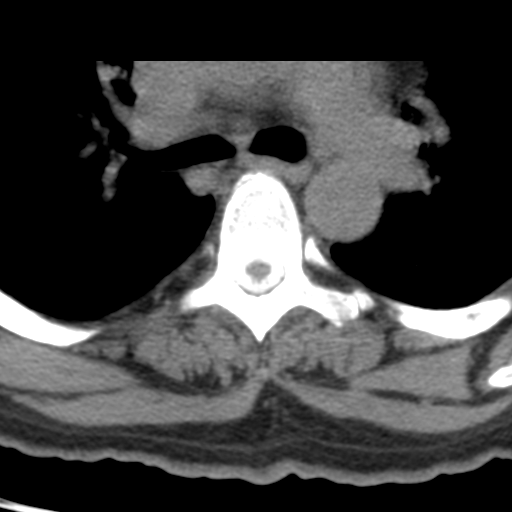
[im 74/107  bone]
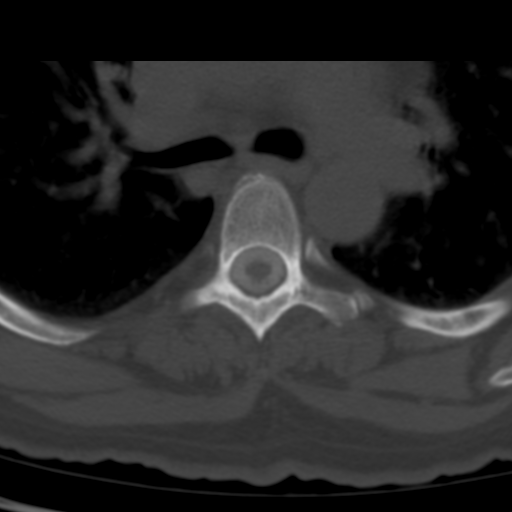
[im 82/107  bone]
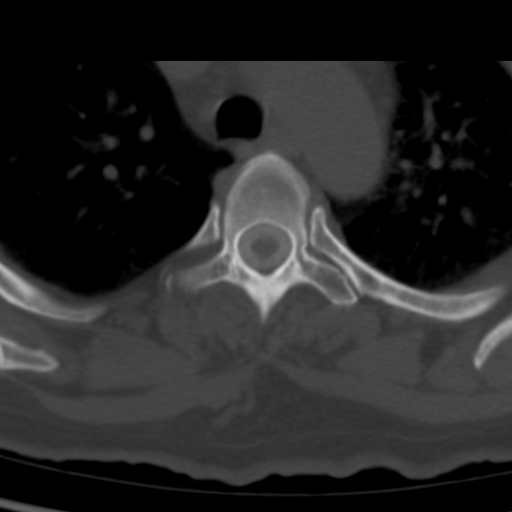
[im 90/107  bone]
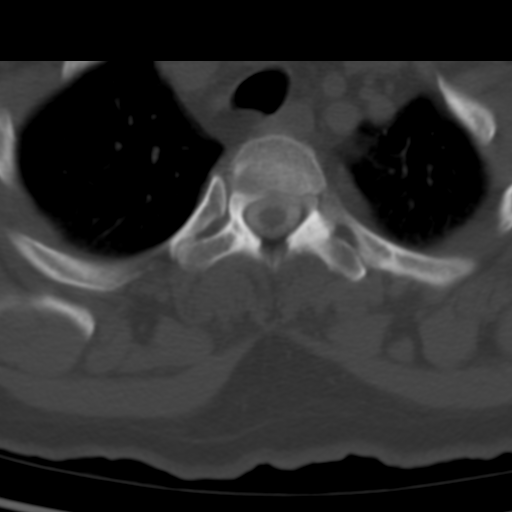
[im 98/107  bone]
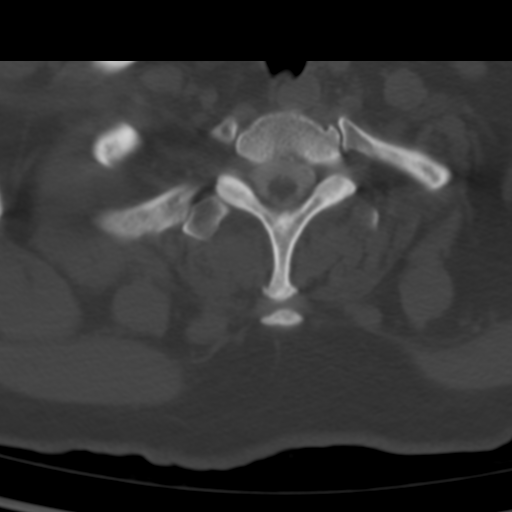

[12 of 14 positions shown; findings below may reference images not displayed]

FLUOROSCOPY TIME:  4 minutes 46 seconds; 0902 uSym6 DAP

PROCEDURE:
LUMBAR PUNCTURE FOR CERVICAL LUMBAR AND THORACIC MYELOGRAM

CERVICAL AND LUMBAR AND THORACIC MYELOGRAM

CT CERVICAL MYELOGRAM

CT LUMBAR MYELOGRAM

CT THORACIC MYELOGRAM

After thorough discussion of risks and benefits of the procedure
including bleeding, infection, injury to nerves, blood vessels,
adjacent structures as well as headache and CSF leak, written and
oral informed consent was obtained. Consent was obtained by Dr. AYAKO
Geffrard.

Patient was positioned prone on the fluoroscopy table. Local
anesthesia was provided with 1% lidocaine without epinephrine after
prepped and draped in the usual sterile fashion. Puncture was
performed at L4 using a standard 22-gauge spinal needle via a right
parasagittal approach. Using a single pass through the dura, the
needle was placed within the thecal sac, with return of clear CSF. 8
mL of Gmnipaque-NWW was injected into the thecal sac, with normal
opacification of the nerve roots and cauda equina consistent with
free flow within the subarachnoid space.

I personally performed the lumbar puncture and administered the
intrathecal contrast. I also personally supervised acquisition of
the myelogram images.
FINDINGS: CERVICAL:

Mild reversal of the normal lordosis in the mid third cervical
spine. No spondylolisthesis.

C2-3: Interspace unremarkable. Mild right facet degenerative
spurring. Central canal and foramina patent.

C3-4: Small right paracentral protrusion without cord distortion.
Left facet DJD. No spinal or foraminal stenosis.

C4-5: Broad right posterolateral protrusion approaches the anterior
aspect of the cervical cord. No spinal or foraminal stenosis.

C5-6: Moderate narrowing of the interspace with circumferential disc
bulge and associated anterior and posterior endplate spurring.
Central canal and foramina patent.

C6-7: Broad right paracentral partially calcified protrusion
approaches the anterior aspect of the cervical cord without
significant flattening or distortion. No spinal or foraminal
stenosis.

C7-T1: Unremarkable

THORACIC:

12 rib-bearing thoracic segments assigned T1-T12. Normal alignment.

Early anterior endplate spurs T3-T11. No other significant osseous
degenerative change.

Vertebral body height maintained throughout. Interspaces
unremarkable. No spinal stenosis. Small amount of dense epidural
contrast from mixed injection extends dorsally up to the T7 level
without mass effect.

Visualized portions of lungs and paraspinal soft tissues
unremarkable.

LUMBAR:

5 non rib-bearing lumbar segments assigned L1-L5. Normal alignment.
Mixed injection with anterior and dorsal epidural contrast, more
dense than that in the thecal sac.

T12-L1: Negative

L1-2: Negative. Conus terminates at the interspace.

L2-3: Negative

L3-4: Interspace unremarkable. Mild right facet DJD. No spinal or
foraminal stenosis.

L4-5: Mild circumferential disc bulge. Early facet DJD contributing
to mild central canal stenosis. Foramina patent.

L5-S1: Unremarkable

Visualized paraspinal soft tissues unremarkable.
IMPRESSION: 1. No convincing evidence of spontaneous CSF leak.
2. Cervical degenerative disc disease C3-C7, without spinal or
foraminal stenosis.
3. Usual anterior endplate spurring in the thoracic spine without
acute finding.
4. Mild multifactorial spinal stenosis L4-5.

## 2021-08-15 ENCOUNTER — Telehealth: Payer: Self-pay

## 2021-08-15 NOTE — Telephone Encounter (Signed)
New message   Pending  Nazareth Norenberg Key: [id number] - PA Case ID: [id number]  Need help? Call us at [phone]  Status Sent to Plan today  Drug AJOVY (fremanezumab-vfrm) injection 225MG /1.5ML syringes  Form [employer] Electronic [place] Form [phone] NCPDP)

## 2021-08-19 NOTE — Telephone Encounter (Signed)
F/u  Letter from [employer] [facility]   Ajovy syringe  225 mg / 1.5 syringe under patient plan, unable to approve this request for the following reason   Coverage is provided for migraine headache prevention . Coverage cannot  be authorize at this time.

## 2021-11-19 ENCOUNTER — Telehealth: Payer: Self-pay

## 2021-11-19 NOTE — Telephone Encounter (Signed)
New message   Received fax from CoverMyMeds on prior authorization Aimovig 140 mg.   The last office visit with Dr. Everlena Cooper was on 05/17/20.   I called & spoke with the patient at [phone], the patient requested the office phone number and verbalized that her head still hurts and the last discussion with MD was about Botox the patient stated if she needs an appointment she will reach out to the office.

## 2021-12-17 ENCOUNTER — Telehealth: Payer: Self-pay

## 2021-12-17 NOTE — Telephone Encounter (Signed)
New message   Received fax from [employer] [facility]   Medication Aimovig   Effective date  11.15.2022 through 12.23.2023
